# Patient Record
Sex: Male | Born: 1955 | Race: White | Hispanic: No
Health system: Midwestern US, Academic
[De-identification: ages and names within clinical notes are randomized; demographics above are authoritative.]

---

## 2016-03-07 IMAGING — CR ABDOMEN
3 series · 3 of 3 positions shown · non-contrast
Comparison: none

PROCEDURE: ABDOMEN

[abd uprt x-wise]
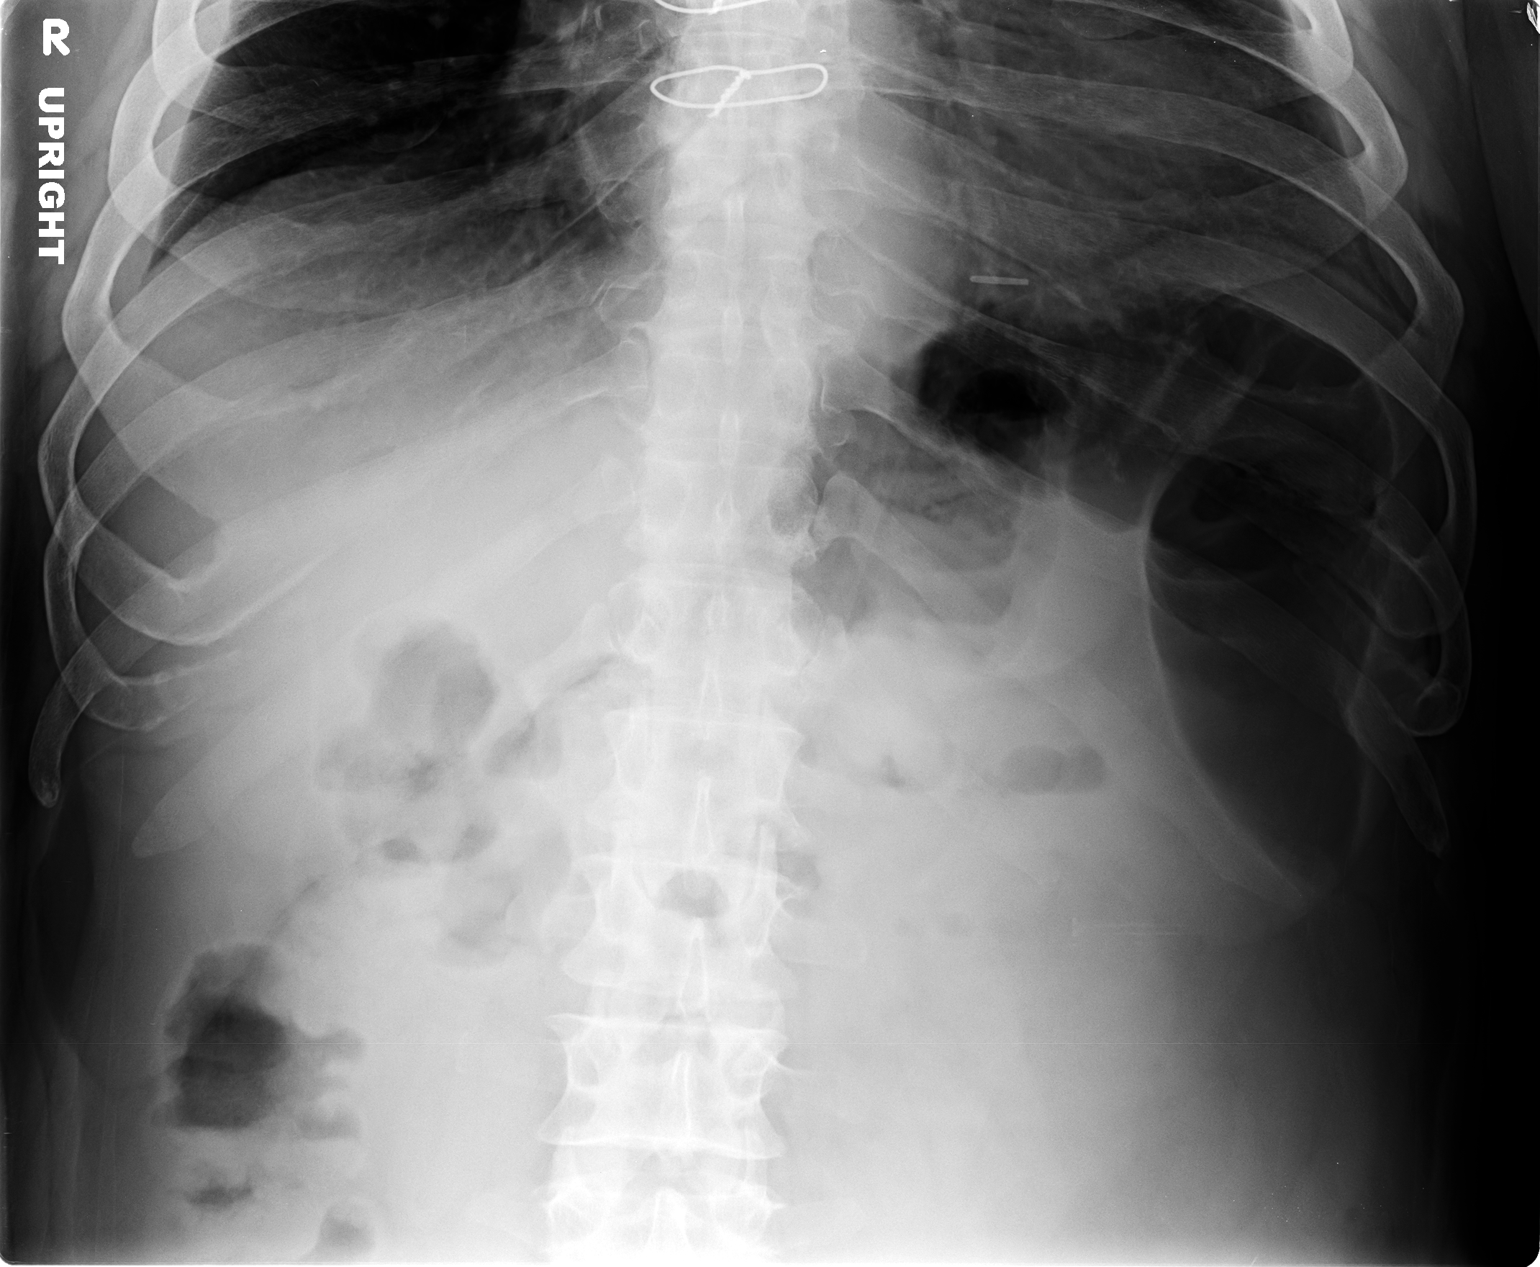

[abd supine x-wise (1 of 2)]
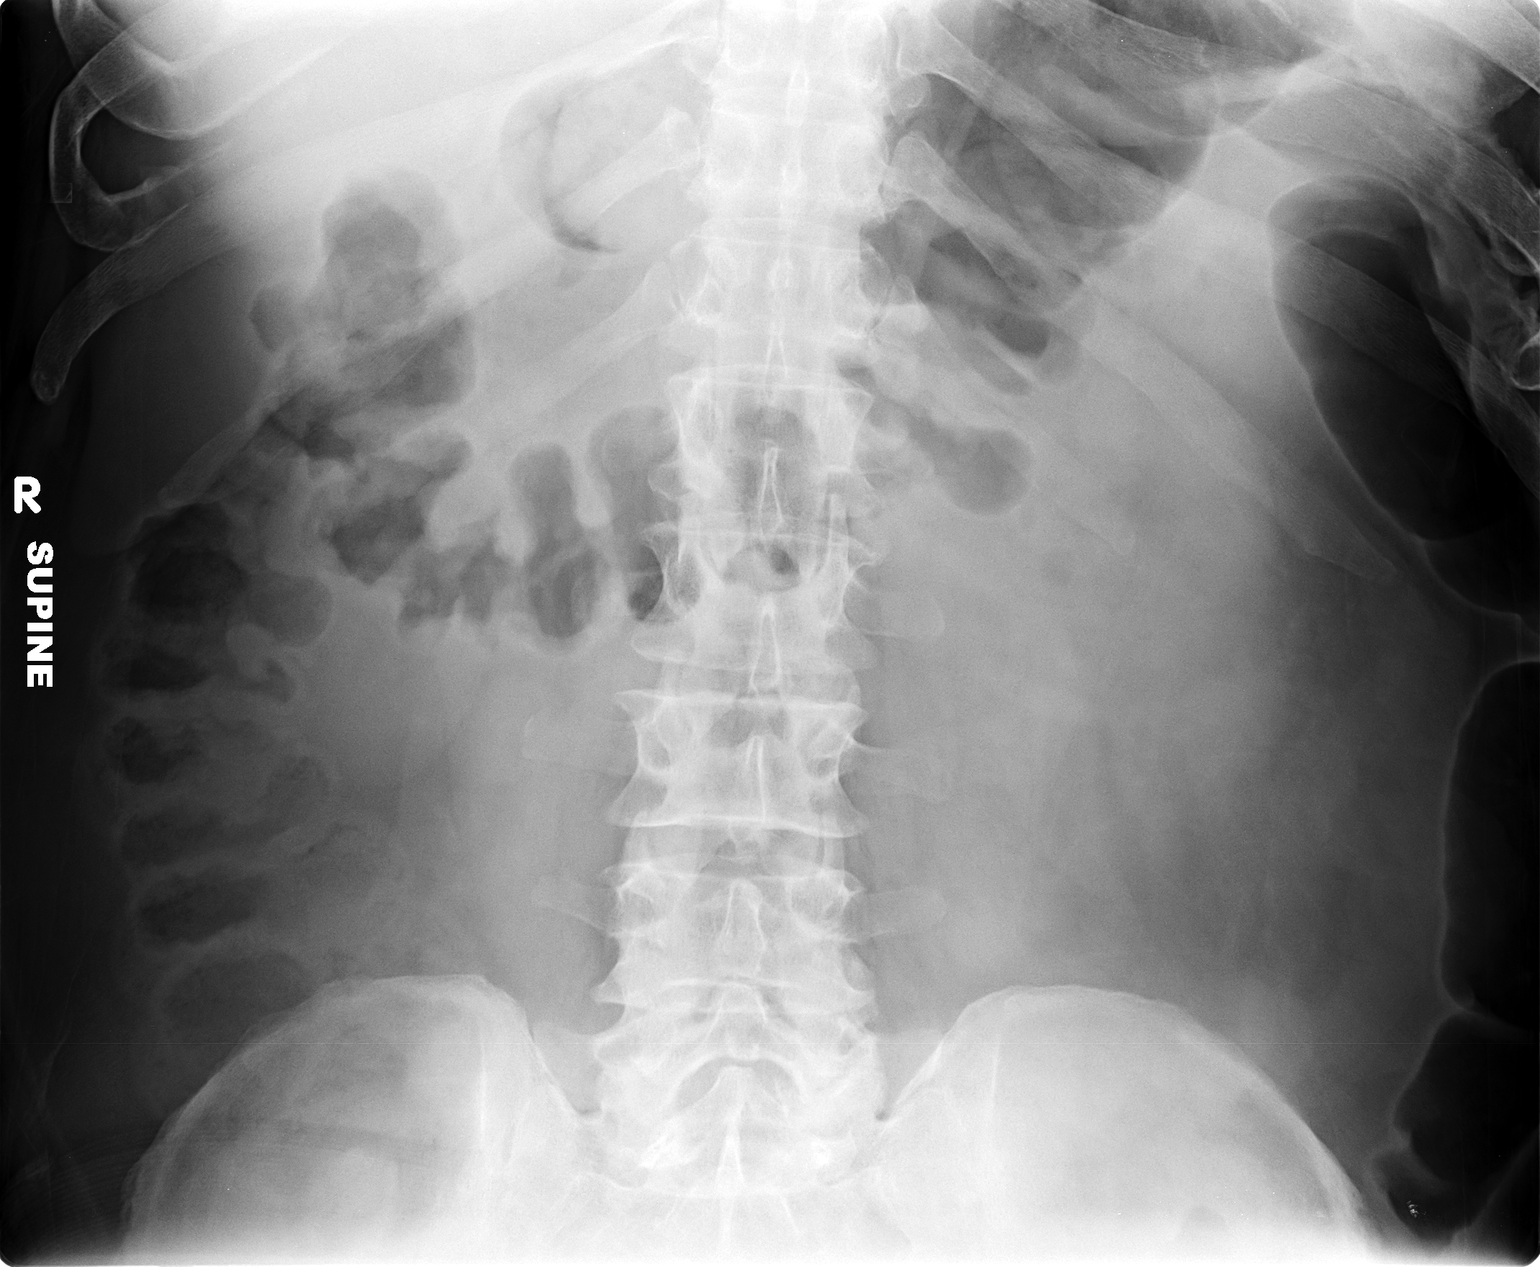

[abd supine x-wise (2 of 2)]
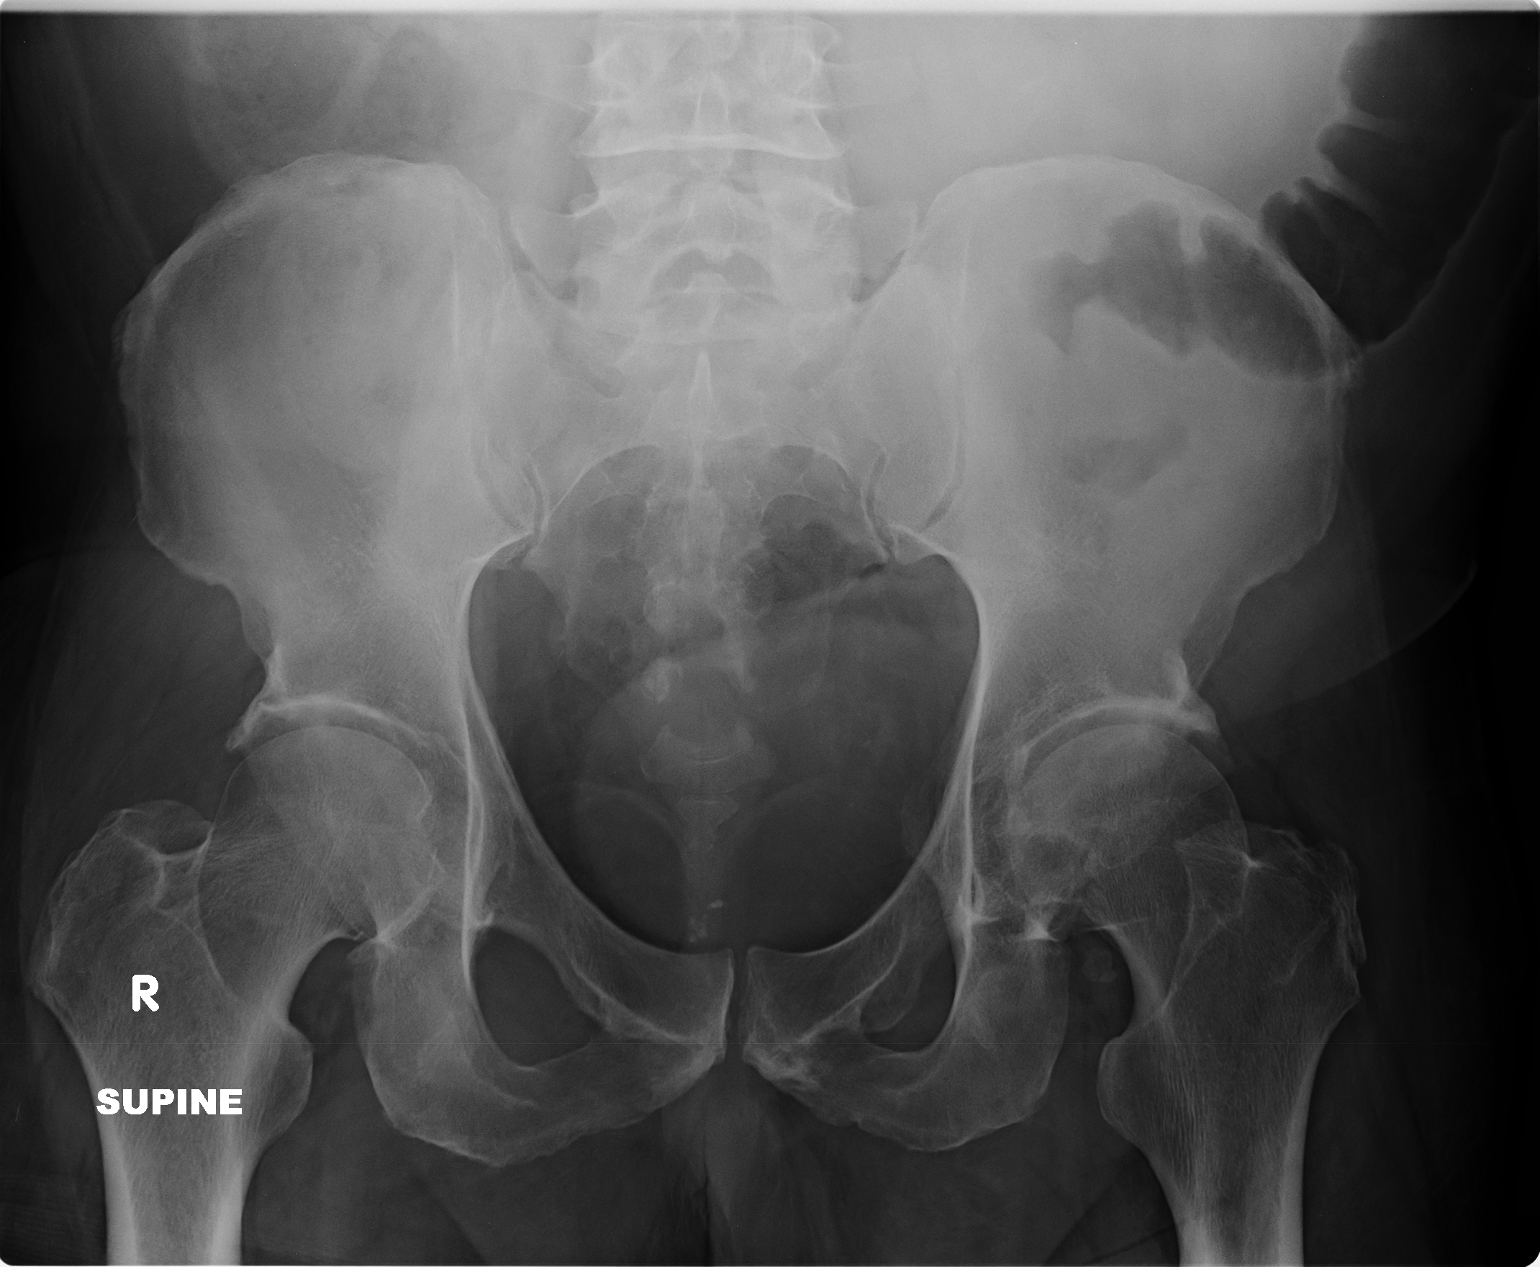

[3 of 3 positions shown; findings below may reference images not displayed]

FINDINGS: Flat and upright images of the abdomen without comparison shows a nonspecific bowel gas
pattern without pathologic dilation. Intraluminal air filled loops of bowel are seen down to the
level of
rectum. Scattered mild to moderate amount of stool is seen throughout the colon and into the rectal
vault. No pathologic calcifications are seen. No free air seen by plain film exam.
IMPRESSION: Nonobstructive bowel gas pattern without free air.

## 2016-03-24 IMAGING — CR CHEST
1 series · 1 of 1 positions shown · non-contrast
Comparison: none

[chest port x-wise]
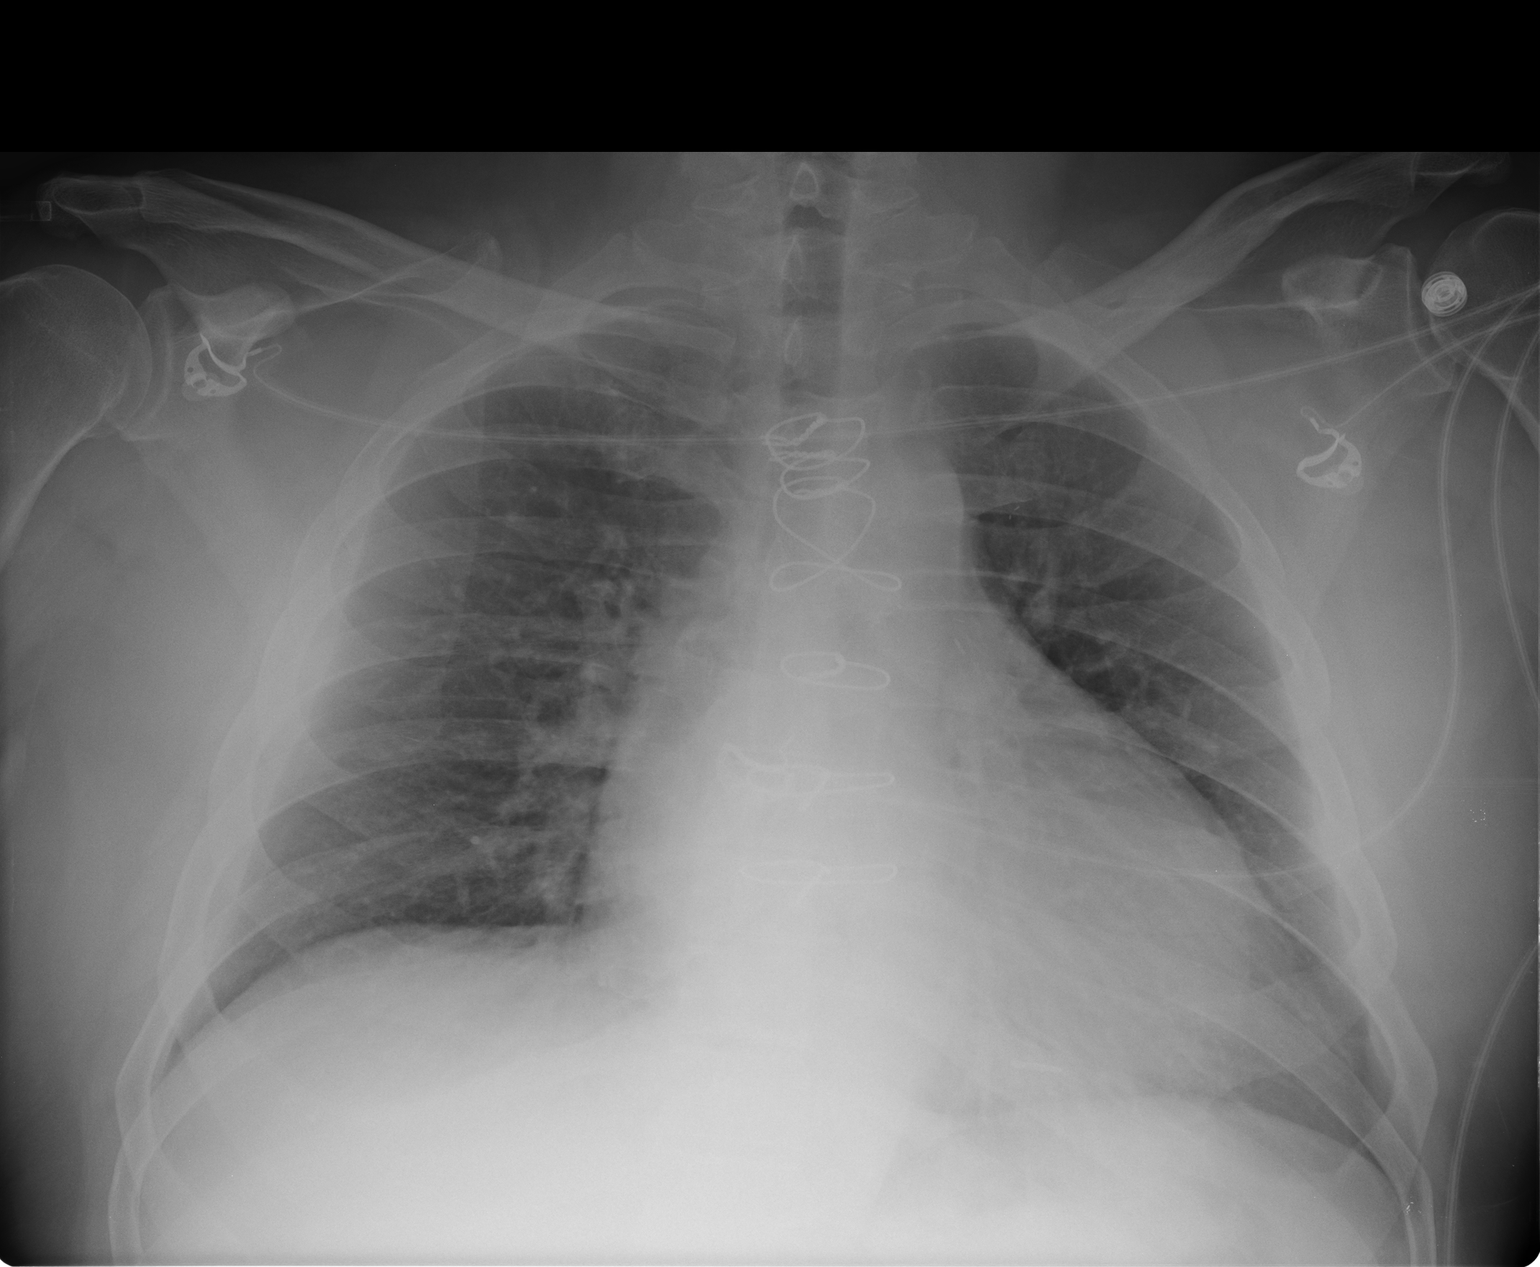

[1 of 1 positions shown; findings below may reference images not displayed]

DIAGNOSTIC STUDIES

EXAM
RADIOLOGICAL EXAMINATION, CHEST; SINGLE VIEW, FRONTAL CPT 70404

INDICATION
Chest pain

TECHNIQUE
Single AP view chest

COMPARISONS
No prior studies are available for comparison.

FINDINGS
The heart is enlarged without pulmonary venous engorgement. Postsurgical changes of median
sternotomy and CABG are evident. There is no dense pulmonary consolidation, pleural effusion, or
pneumothorax.

IMPRESSION
Cardiomegaly without evidence of congestive heart failure or dense consolidation to suggest
pneumonia.
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
## 2016-07-12 IMAGING — CR CHEST
3 series · 3 of 3 positions shown · non-contrast
Comparison: none

[shoulder external]
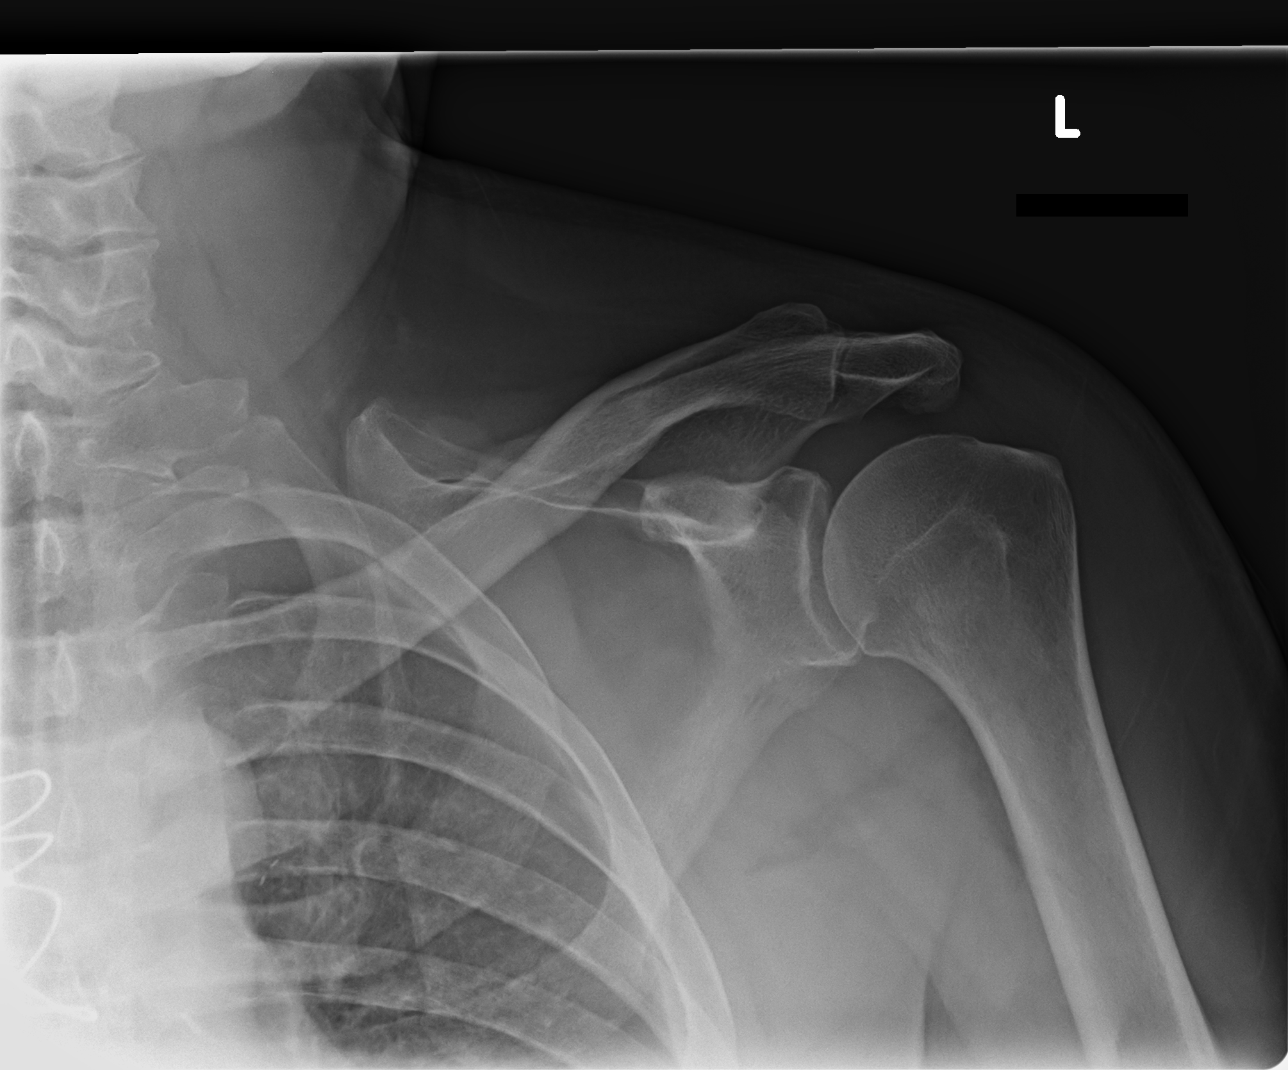

[shoulder internal]
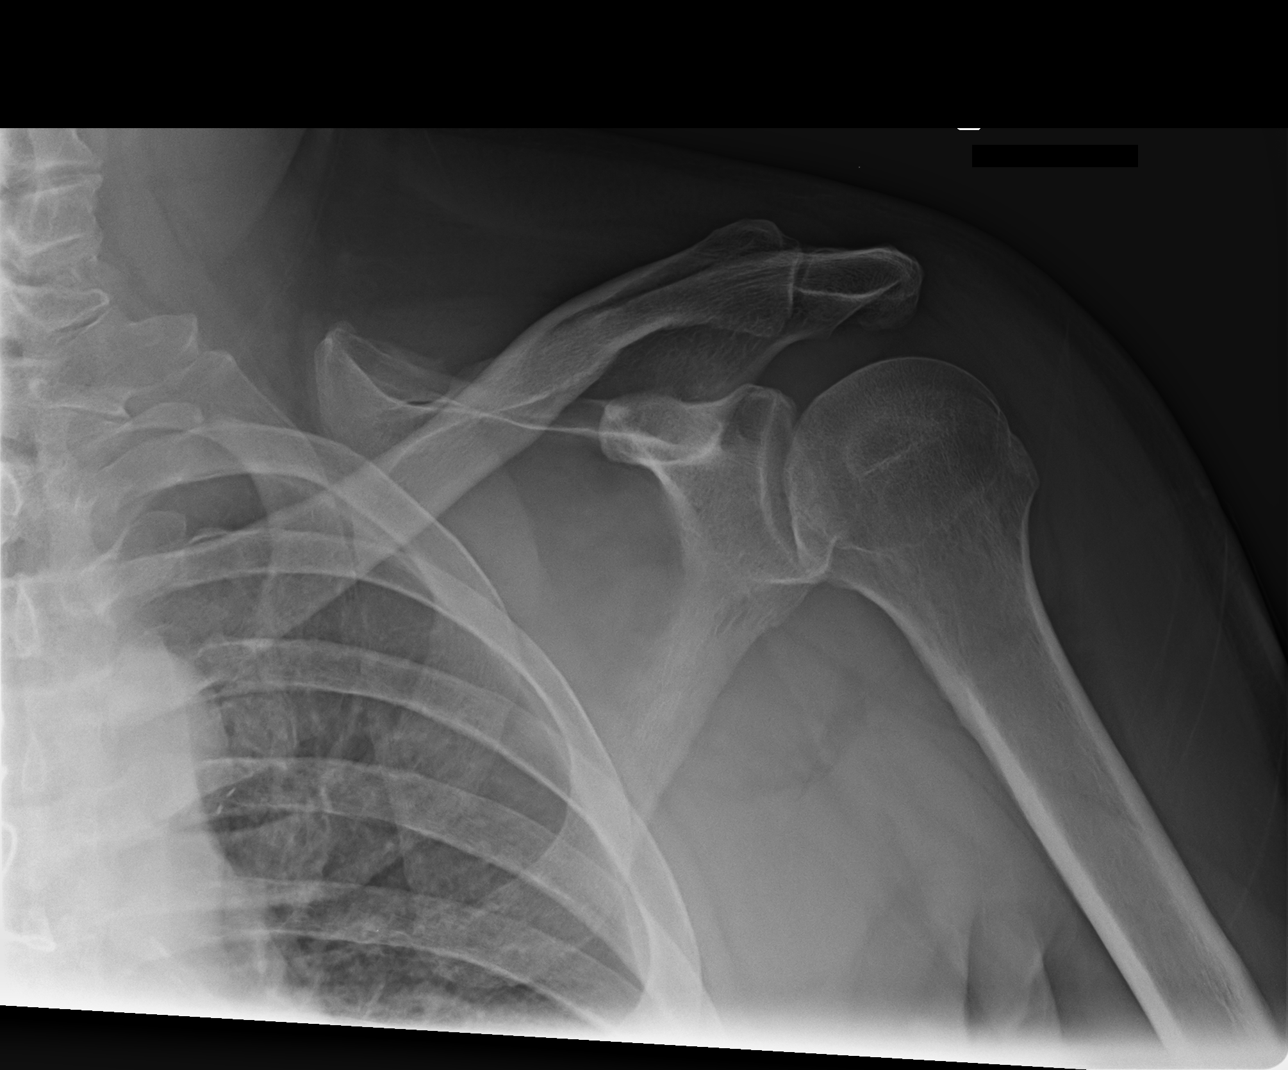

[shoulder y-view]
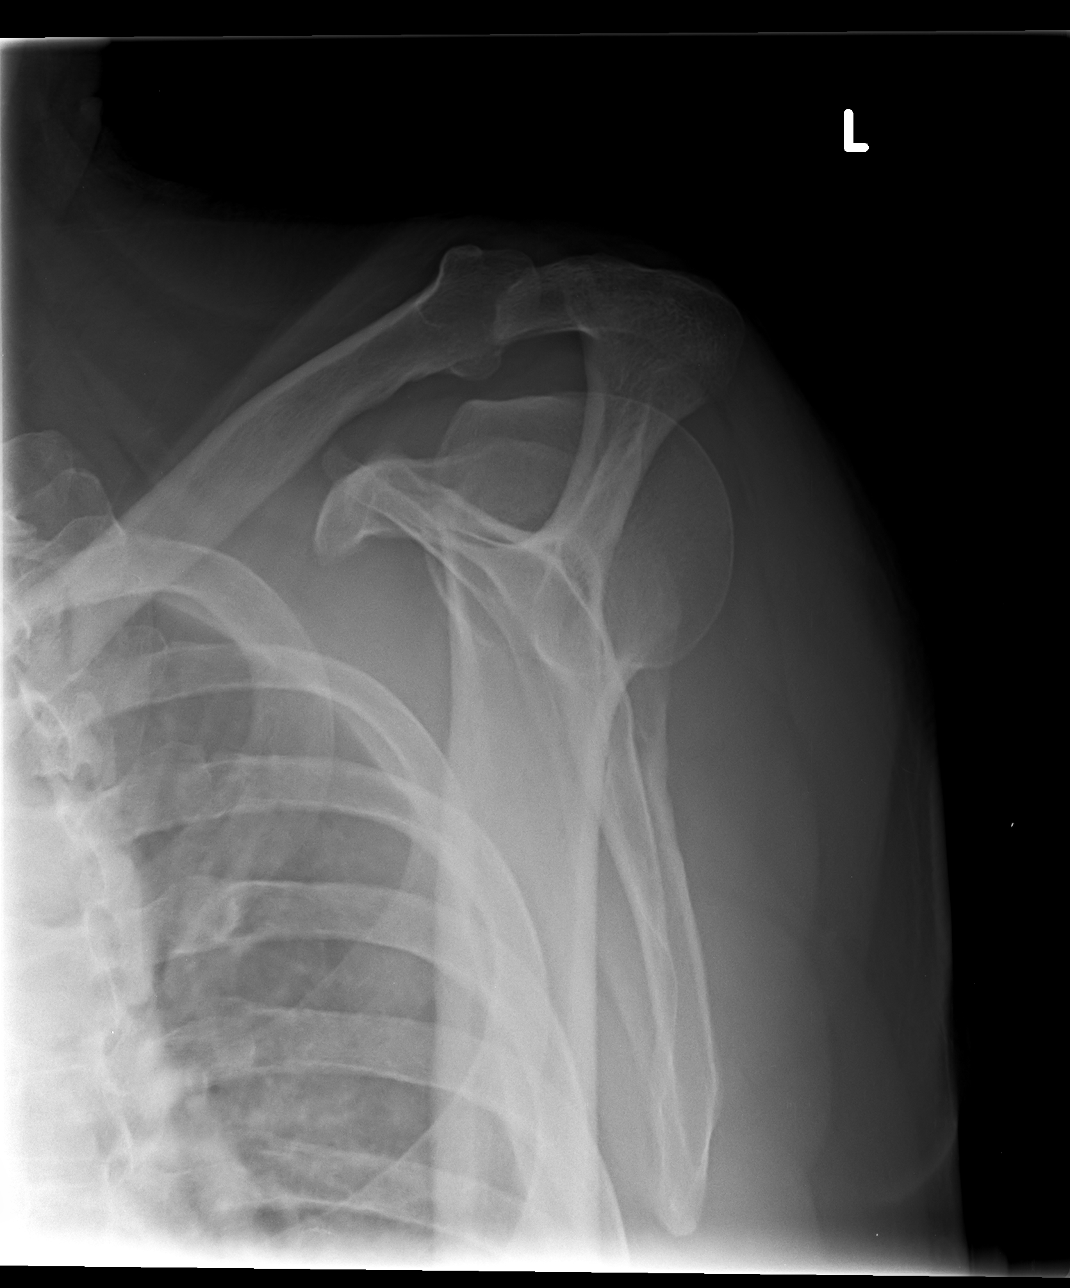

[3 of 3 positions shown; findings below may reference images not displayed]

DIAGNOSTIC STUDIES

EXAM

RADIOLOGICAL EXAMINATION SHOULDER; COMPLETE, MINIMUM 2 VIEWS CPT 99090

INDICATION

NECK PAIN INTO SHOULDER
PT STATES HAS NECK PAIN AND RADIATES INTO LEFT SHOULDER. TJ/CK

TECHNIQUE

AP internal external and Y views of the left shoulder was obtained.

COMPARISONS

No priors available for comparison.

FINDINGS

[No acute fracture or dislocation.] [AC joint appears aligned.] Mild subchondral sclerosis within
the glenohumeral and acromioclavicular joints.

IMPRESSION

Mild osteoarthritis.

Further evaluation with an MRI is suggested if the patient's symptoms persist and if clinically
indicated.

## 2016-07-12 IMAGING — CR NECK
4 series · 4 of 4 positions shown · non-contrast
Comparison: none

[c-spine lat]
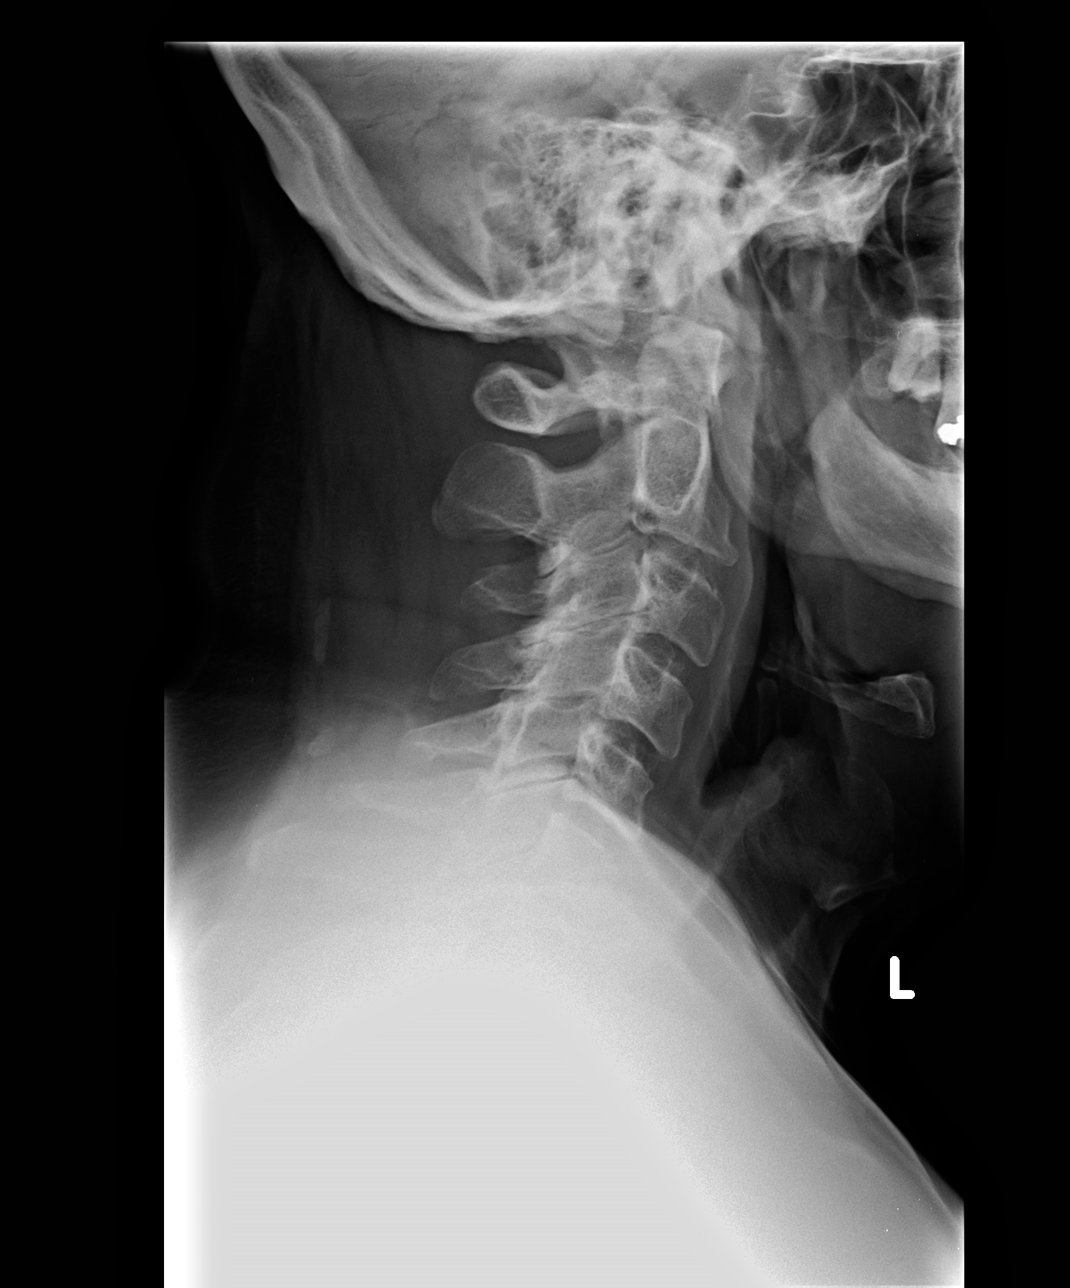

[c-spine ap]
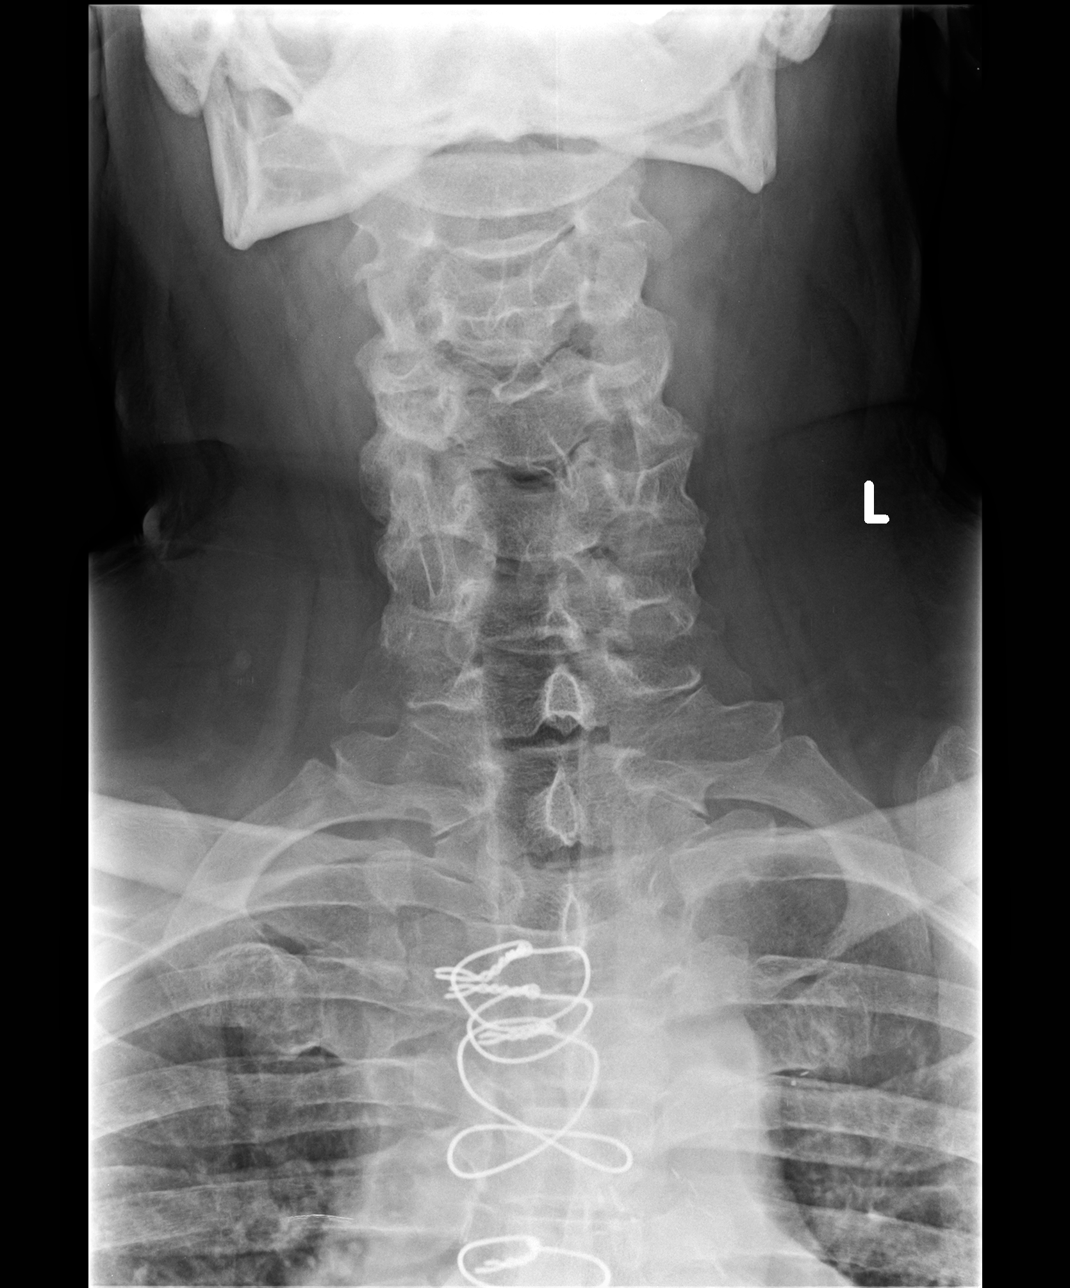

[odontoid]
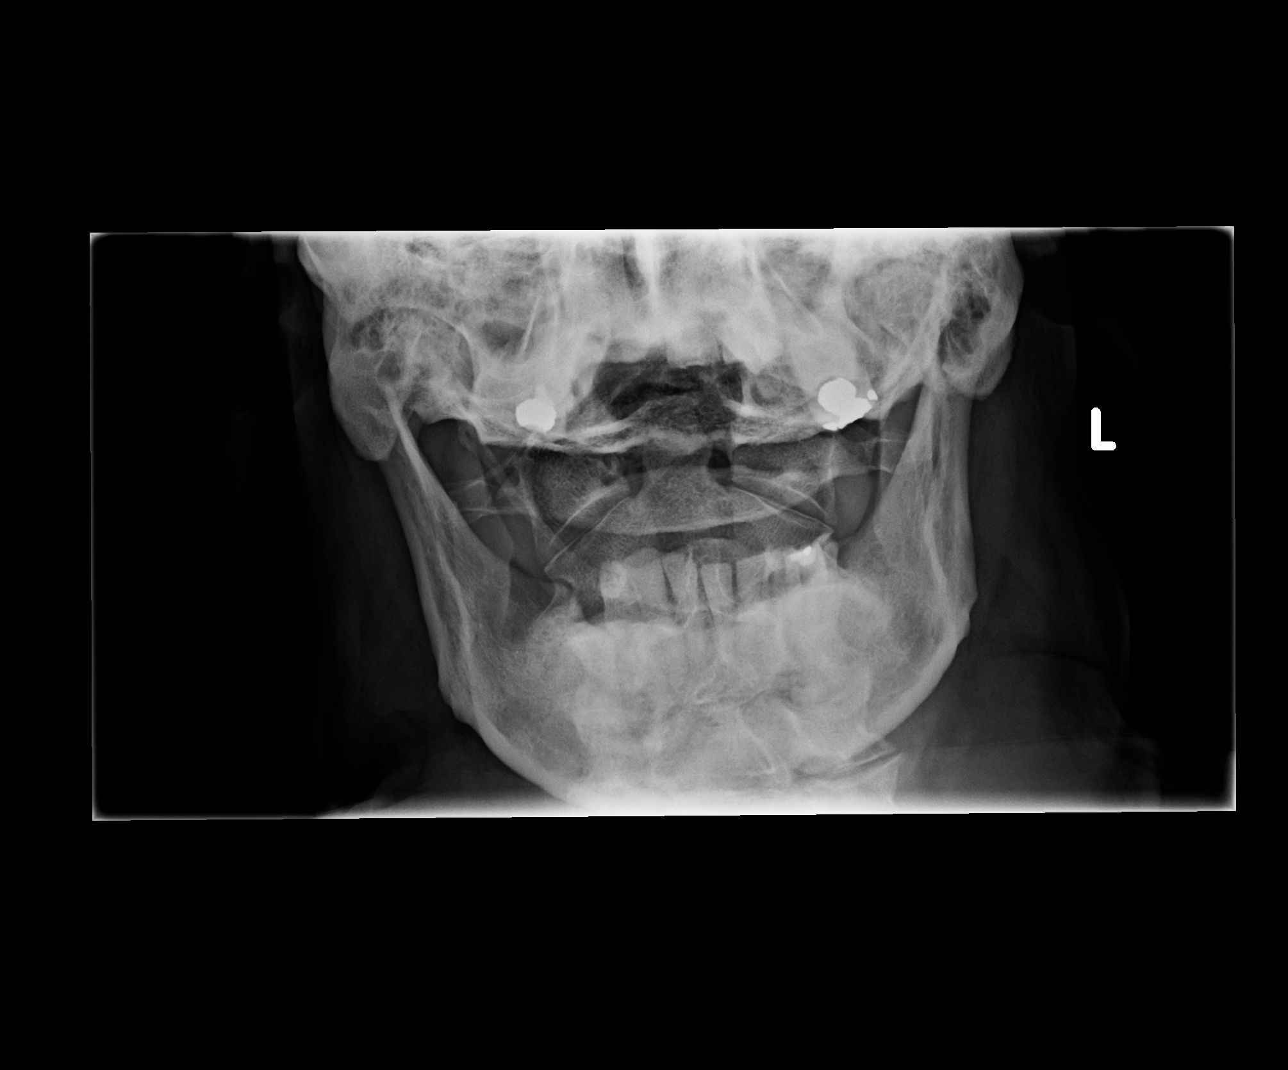

[swimmers]
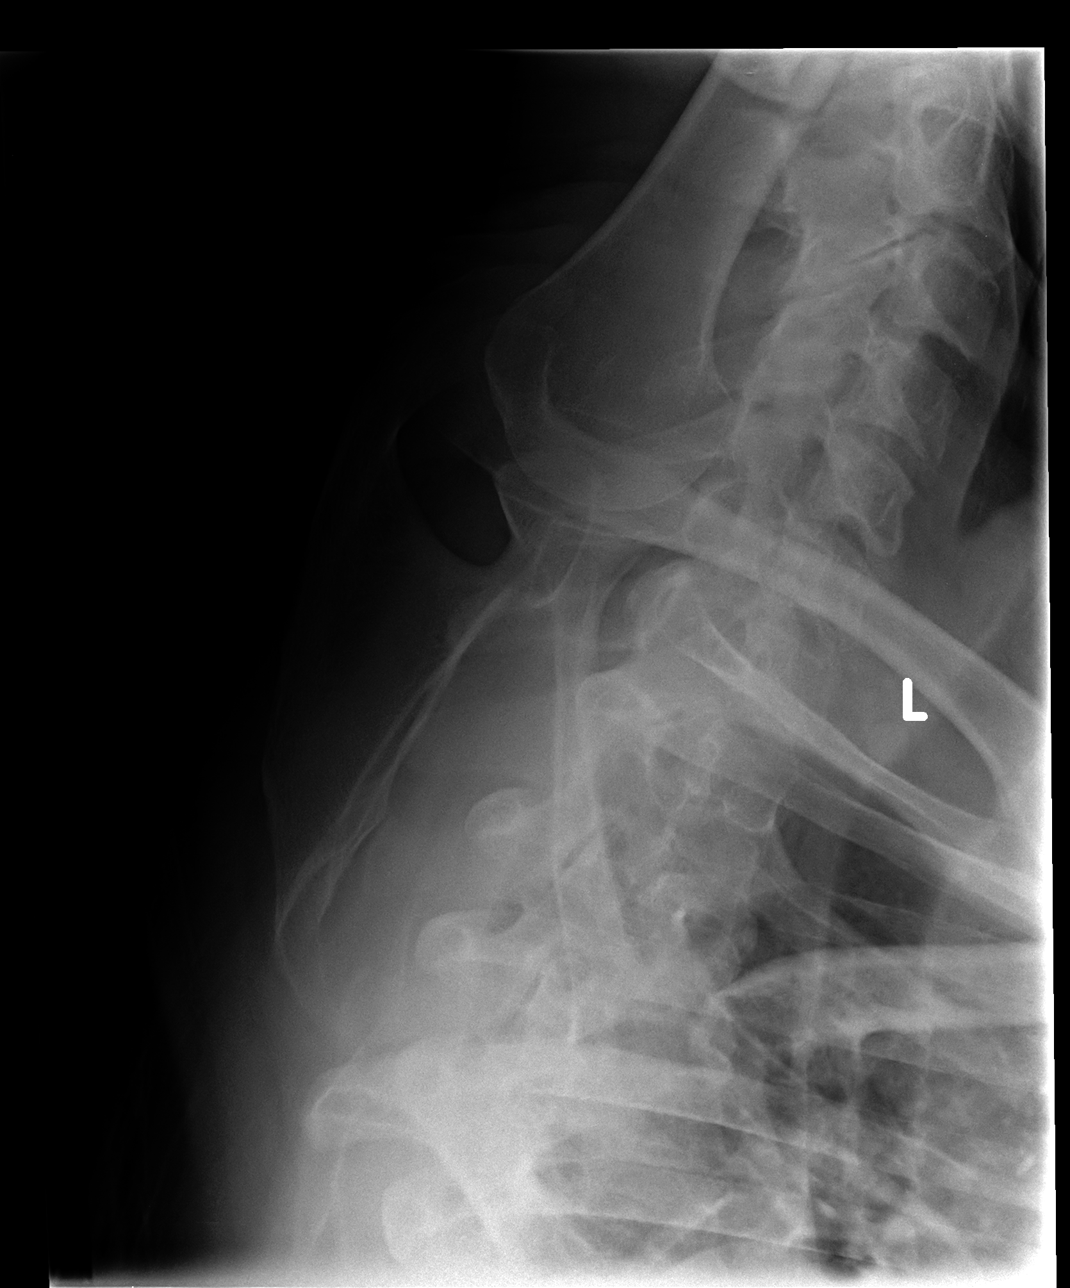

[4 of 4 positions shown; findings below may reference images not displayed]

DIAGNOSTIC STUDIES

EXAM

RADIOLOGICAL EXAMINATION, SPINE, CERVICAL 2 OR 3 VIEWS, CPT 35494

INDICATION

NECK PAIN INTO SHOULDER
PT STATES HAS NECK PAIN AND RADIATES INTO LEFT SHOULDER. TJ/CK

COMPARISONS

No priors available for comparison.

FINDINGS

There is mild retrolisthesis of C2. No acute displaced fracture or dislocation. There is mild loss
of disc height with associated sclerosis throughout the cervical spine. There is mild associated
facet arthropathy within the mid cervical spine. No significant prevertebral soft tissue swelling.

IMPRESSION

Mild multilevel degenerative changes throughout the cervical spine.

Further evaluation with MRI is suggested if clinically indicated.

## 2016-11-28 IMAGING — CT CT HEAD WO CONTRAST
1 series · 15 of 30 positions shown, 19 images · non-contrast
Comparison: none

[Series 2: brain w/o 4.8 brain · axial · non-contrast · 0.48mm/px · z∈[+104,+260]mm · 15 of 36 slices shown, 19 images]
[im 2/36  brain]
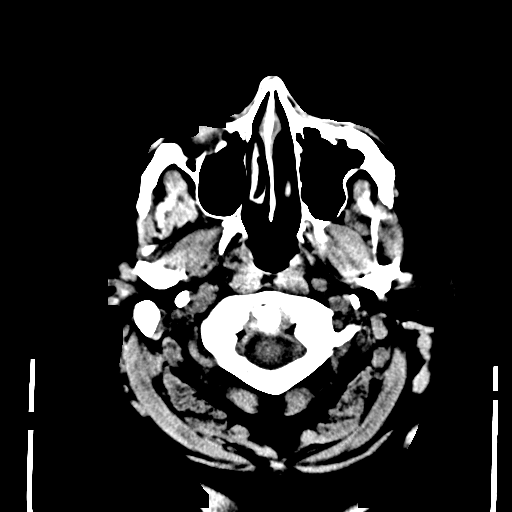
[im 2/36  bone]
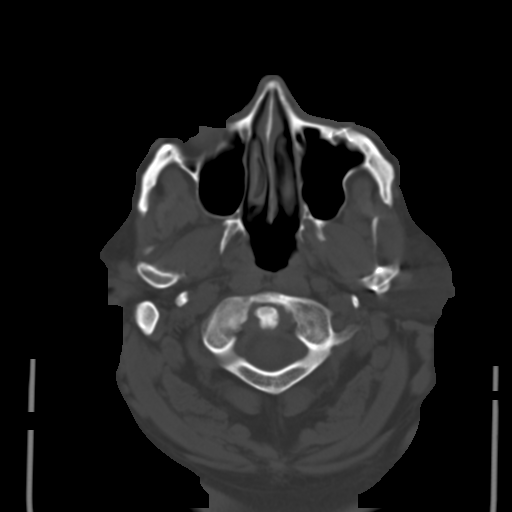
[im 4/36  brain]
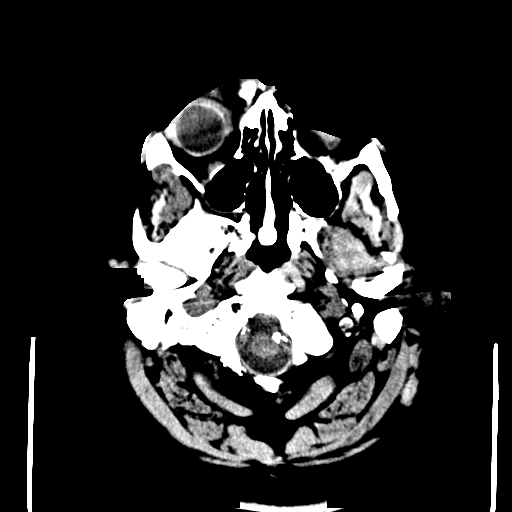
[im 7/36  brain]
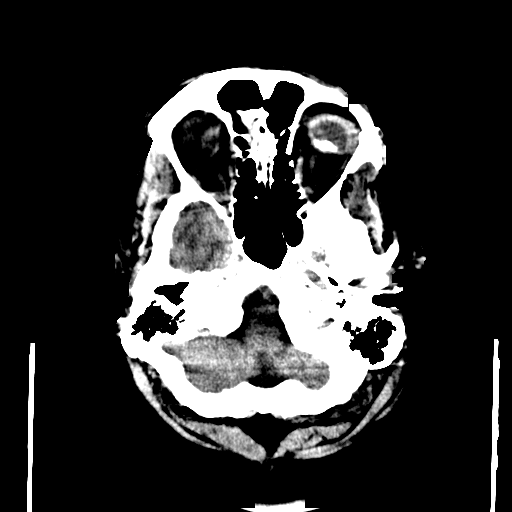
[im 9/36  brain]
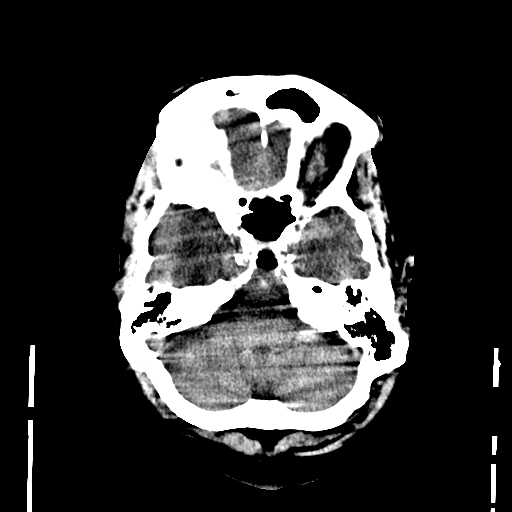
[im 11/36  brain]
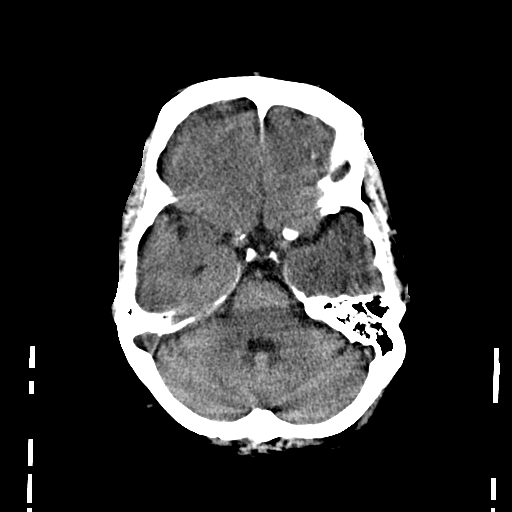
[im 11/36  bone]
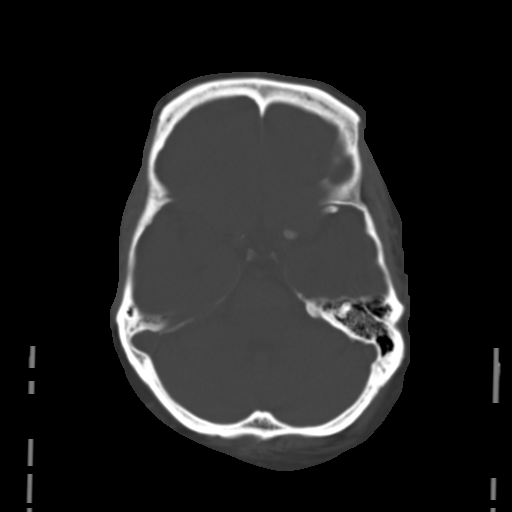
[im 14/36  brain]
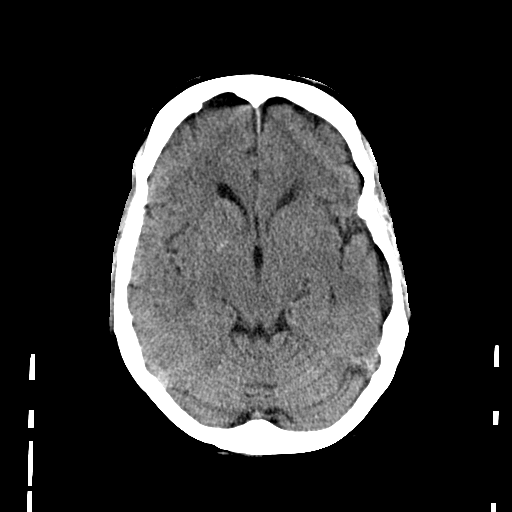
[im 16/36  brain]
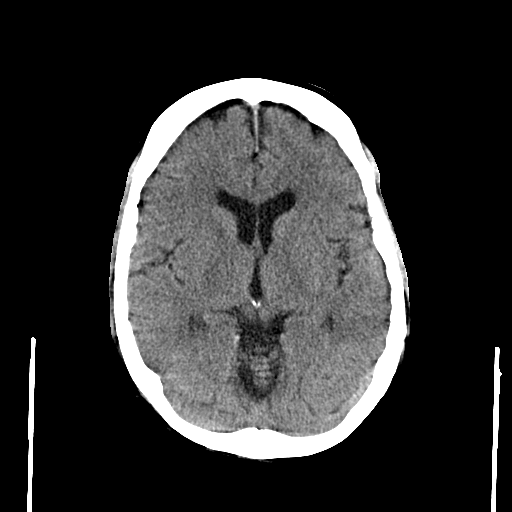
[im 19/36  brain]
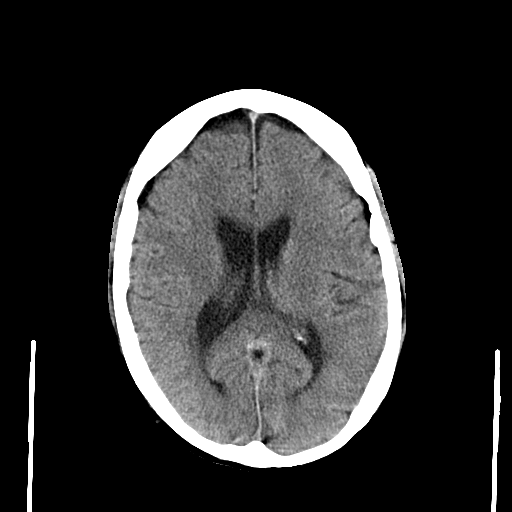
[im 20/36  brain]
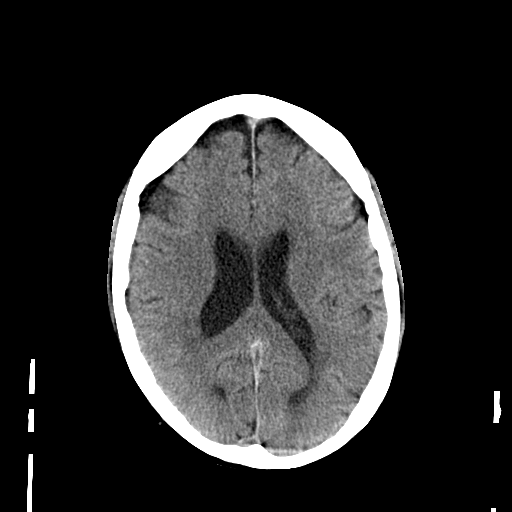
[im 20/36  bone]
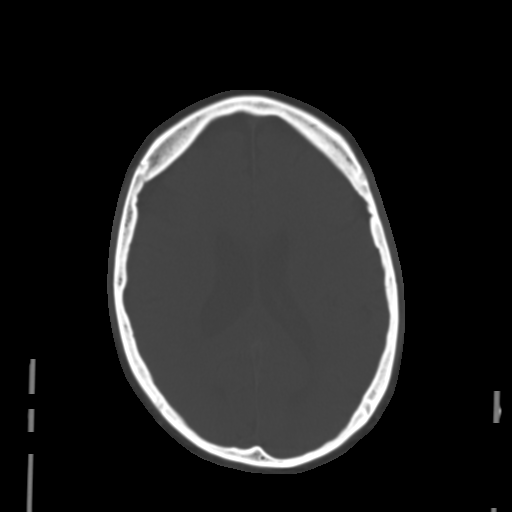
[im 22/36  brain]
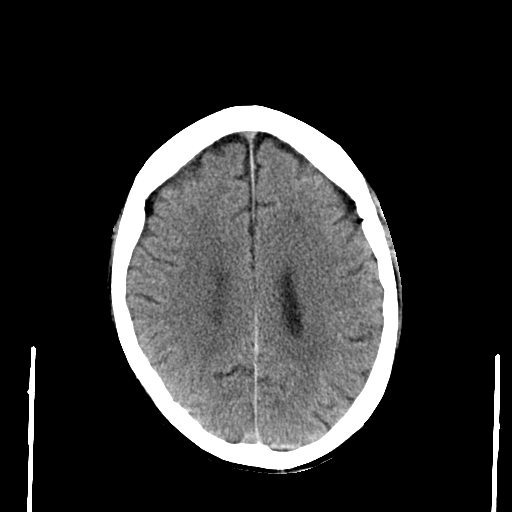
[im 25/36  brain]
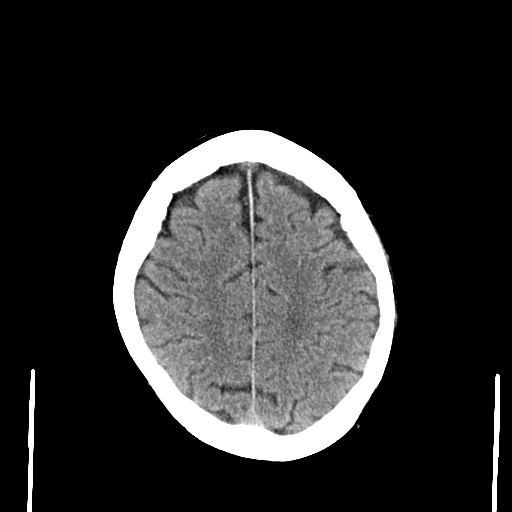
[im 27/36  brain]
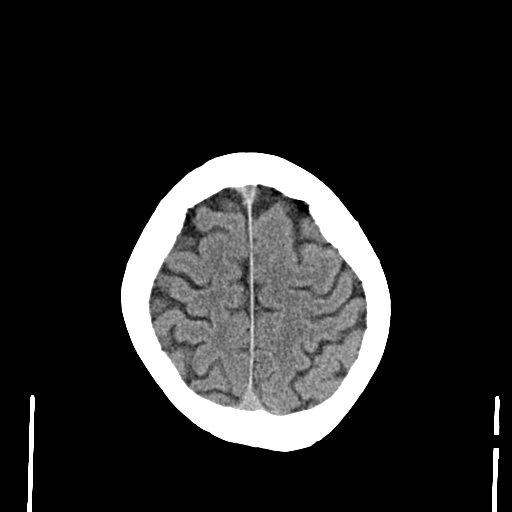
[im 29/36  brain]
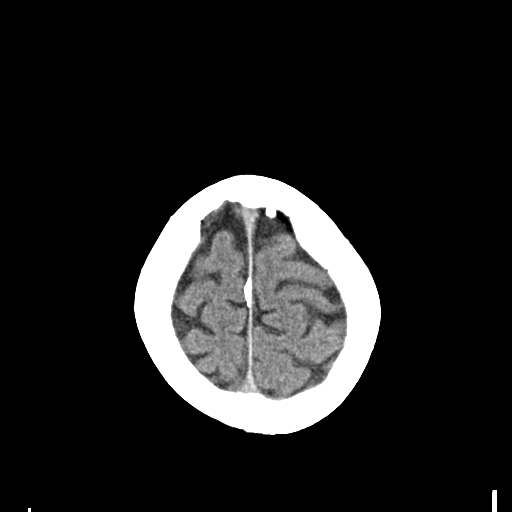
[im 29/36  bone]
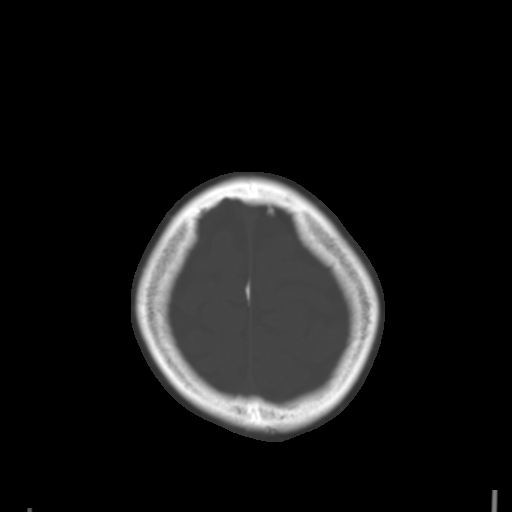
[im 32/36  brain]
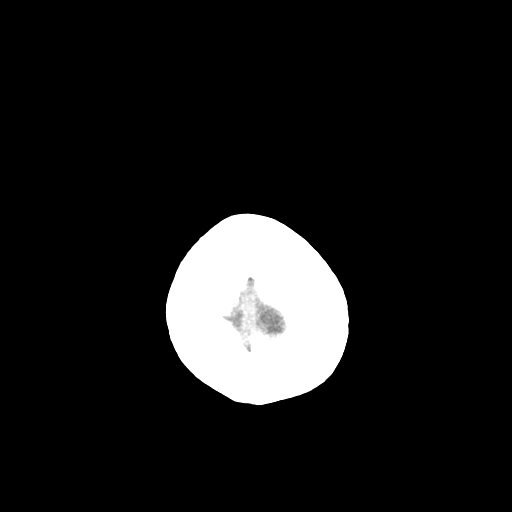
[im 34/36  brain]
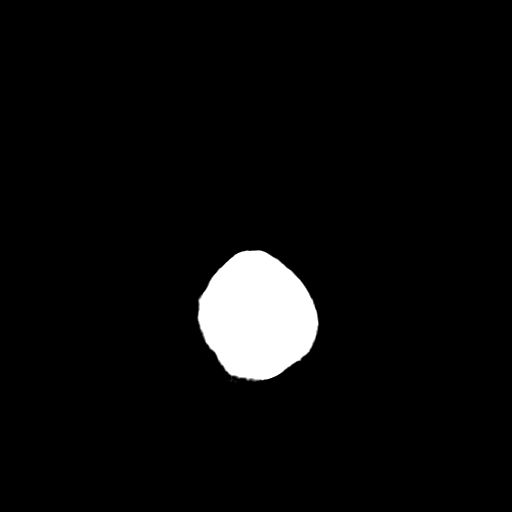

[15 of 30 positions shown; findings below may reference images not displayed]

EXAM
COMPUTED TOMOGRAPHY, HEAD OR BRAIN; WITHOUT CONTRAST MATERIAL CPT 98568

INDICATION
VISUAL DISTURBANCE, WEAK LOWER EXTREMITIES
Visual disturbance, weakness of lower extremity.
BG

TECHNIQUE
Multiple contiguous transaxial images were obtained through the brain utilizing a multidetector CT
scanner.
All CT scans at this facility use dose modulation, iterative reconstruction, and/or weight based
dosing when appropriate to reduce radiation dose to as low as reasonably achievable.

COMPARISONS
I do not have any previous examinations available for comparison at the time of dictation.

FINDINGS
There is diffuse cortical atrophy. The ventricles and sulci are accordingly prominent. There is no
mass effect or shift in the midline structures. Lucency throughout the centrum ovale is consistent
with deep white matter ischemic changes and periventricular leukomalacia.
Increased density is identified in the left eye globe is suggestive of perhaps a chronic retinal
detachment. The shape of the left eye globe is also abnormal. Clinical correlation is advised.
Findings may be chronic in nature. The skull base overlying and overlying calvarium are within
normal limits.

IMPRESSION
Cortical atrophy and age related involutional changes. Periventricular leukomalacia. No acute
intracranial hemorrhage. Chronic retinal detachment on the left is suspected.
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
## 2017-09-28 IMAGING — CT Head^_WITHOUT_CONTRAST (Adult)
1 series · 16 of 30 positions shown, 20 images · non-contrast
Comparison: none

[Series 2: brain w/o 4.8 brain · axial · non-contrast · 0.42mm/px · z∈[+132,+291]mm · 16 of 36 slices shown, 20 images]
[im 2/36  brain]
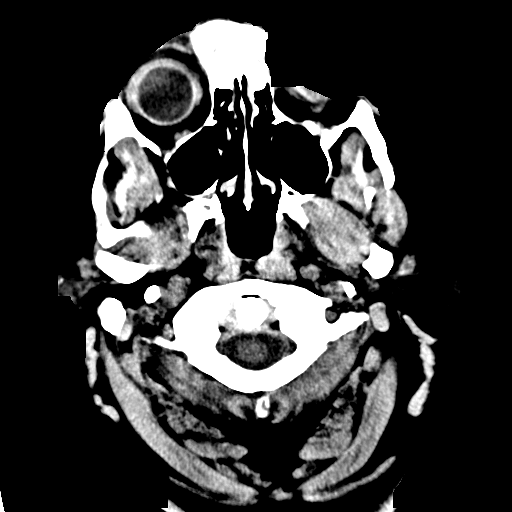
[im 2/36  bone]
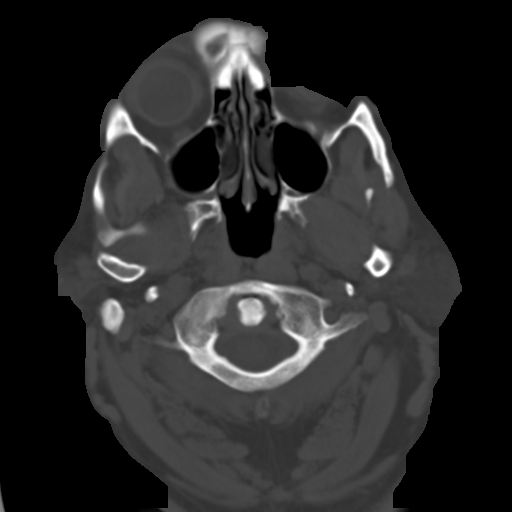
[im 4/36  brain]
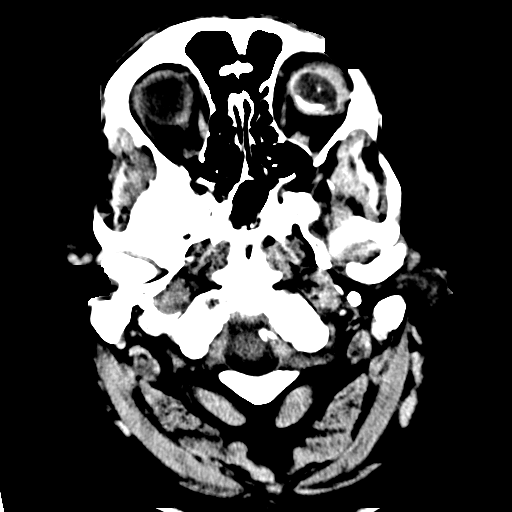
[im 7/36  brain]
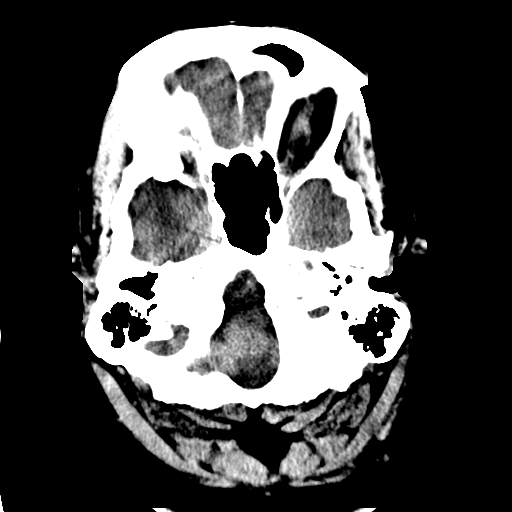
[im 9/36  brain]
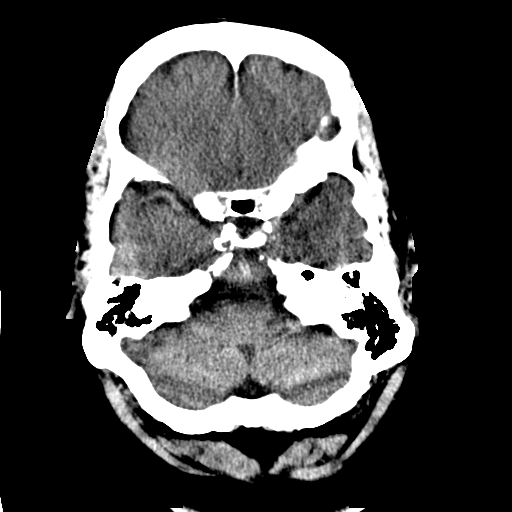
[im 10/36  brain]
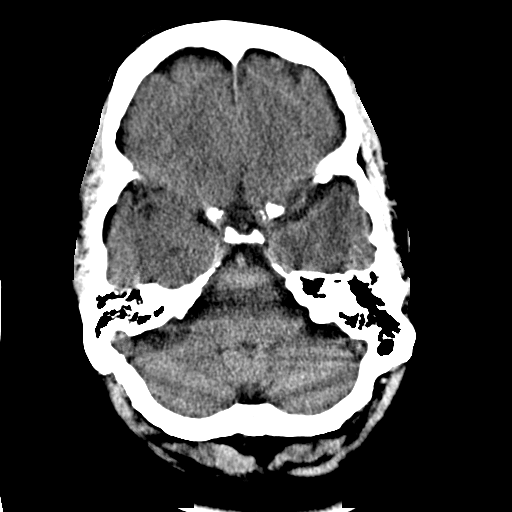
[im 10/36  bone]
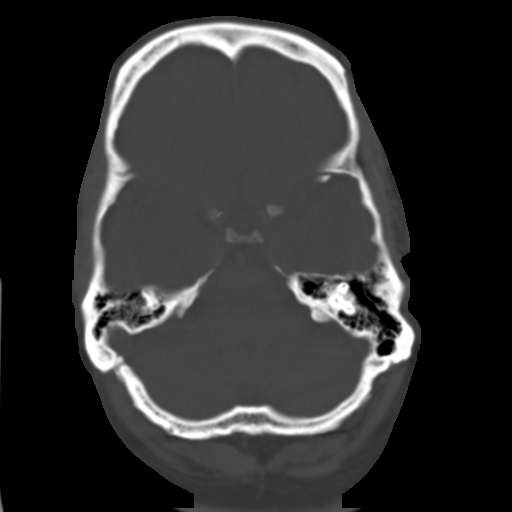
[im 13/36  brain]
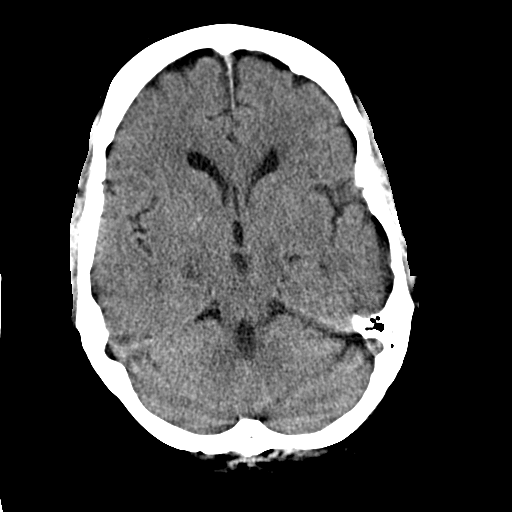
[im 15/36  brain]
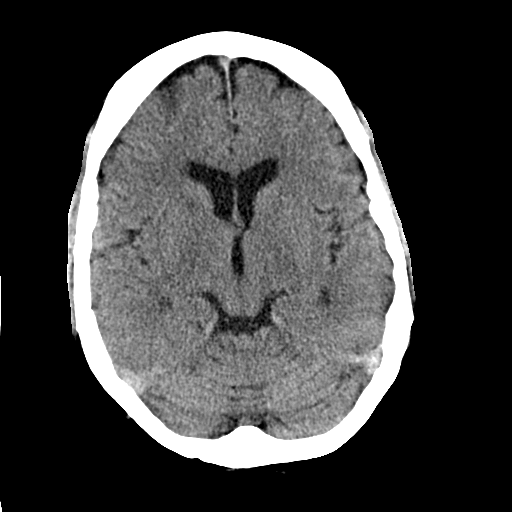
[im 17/36  brain]
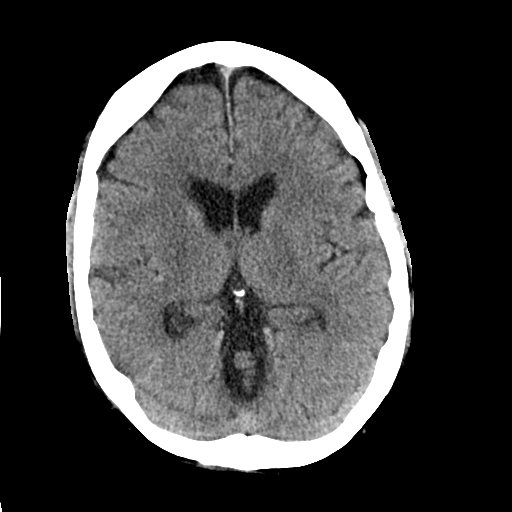
[im 19/36  brain]
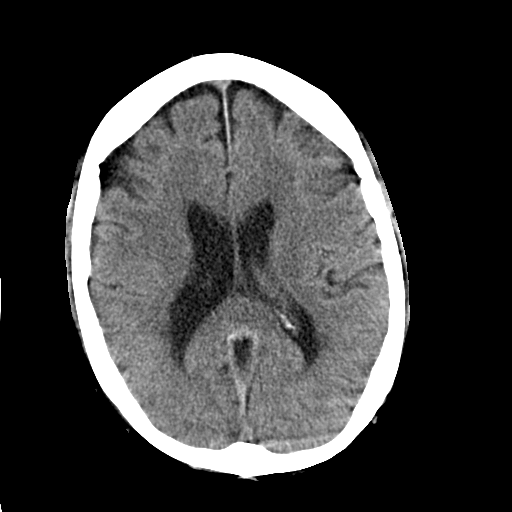
[im 19/36  bone]
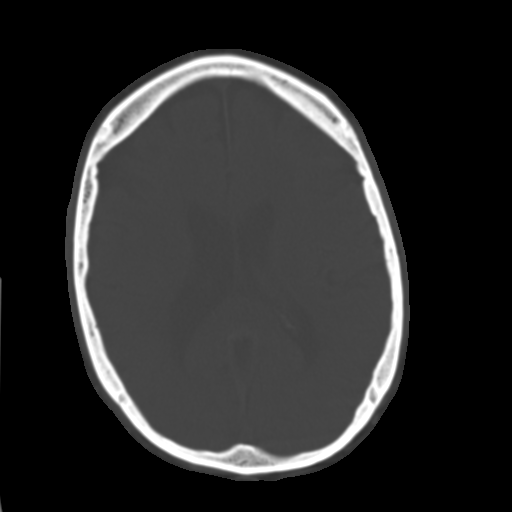
[im 21/36  brain]
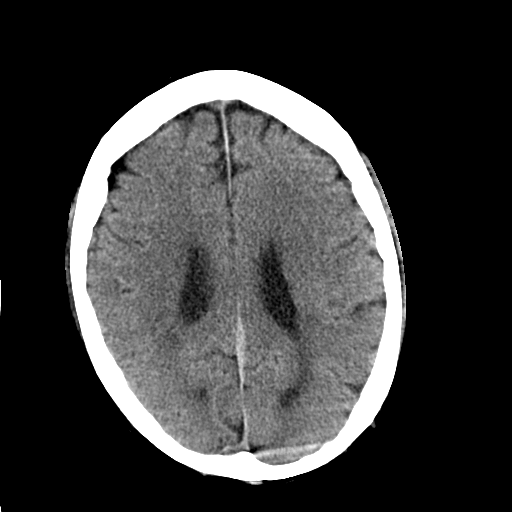
[im 23/36  brain]
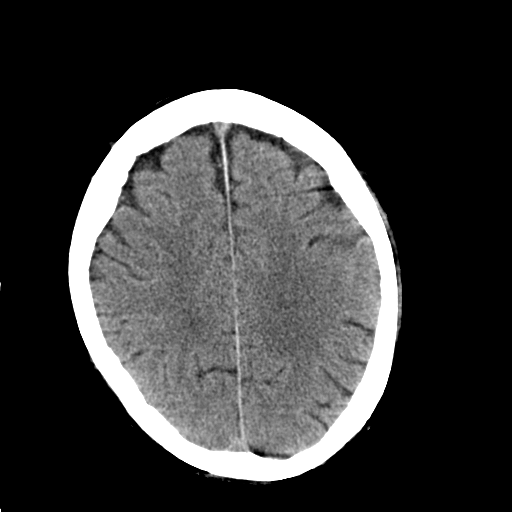
[im 26/36  brain]
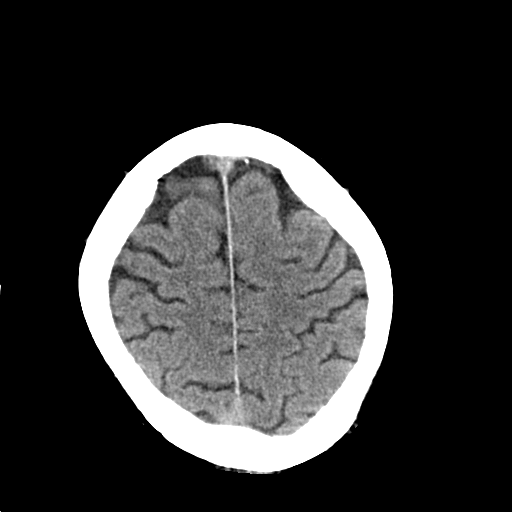
[im 27/36  brain]
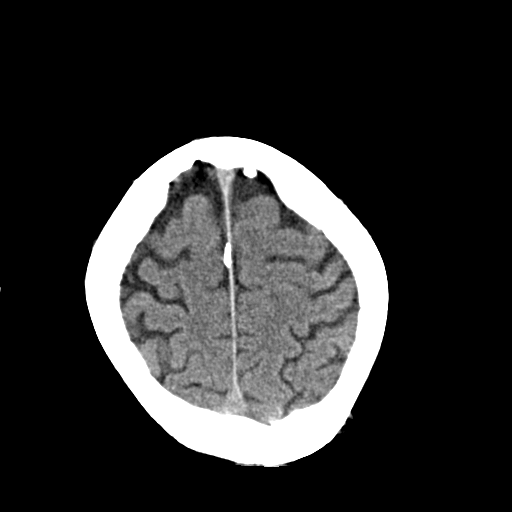
[im 27/36  bone]
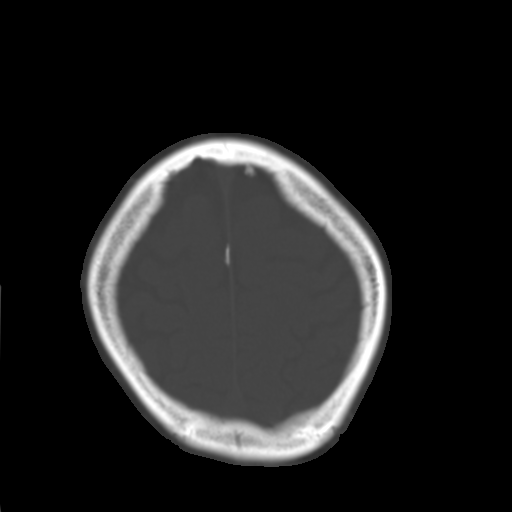
[im 29/36  brain]
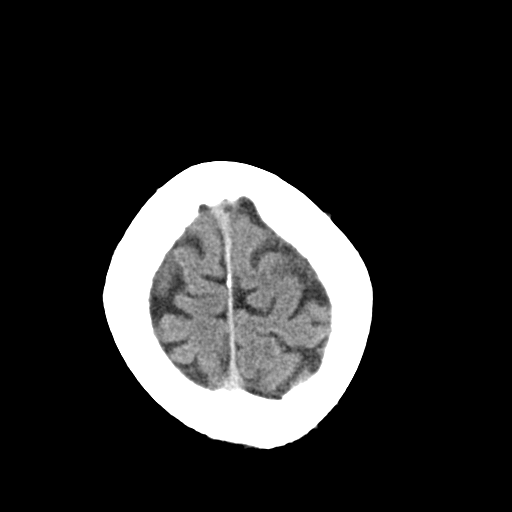
[im 32/36  brain]
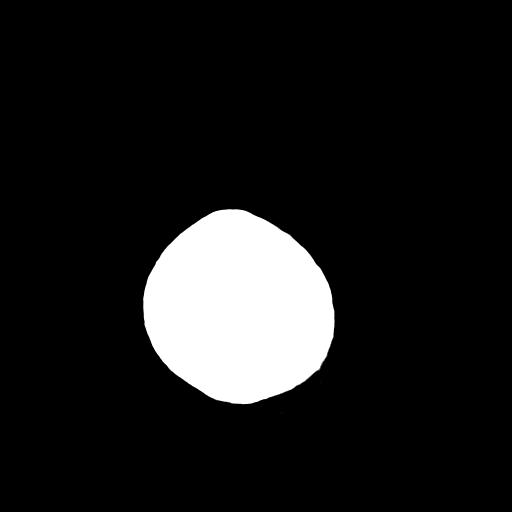
[im 34/36  brain]
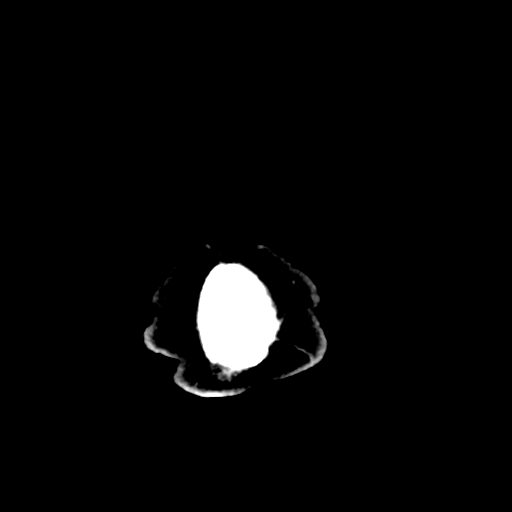

[16 of 30 positions shown; findings below may reference images not displayed]

All CT scans at this facility use dose modulation, iterative reconstruction, and/or weight based
dosing when appropriate to reduce radiation dose to as low as reasonably achievable.

EXAM

CT the brain.

INDICATION

Dizziness.

FINDINGS

The ventricles and sulci are within normal limits. There is no midline shift or hydrocephalus.

There is no intracranial hemorrhage or hematoma.

IMPRESSION

No hemorrhage or hydrocephalus is identified.

Tech Notes:

DIZZINESS X LAST NIGHT. AW

## 2017-09-28 IMAGING — CR CHEST
1 series · 1 of 1 positions shown · non-contrast
Comparison: none

[chest pa x-wise]
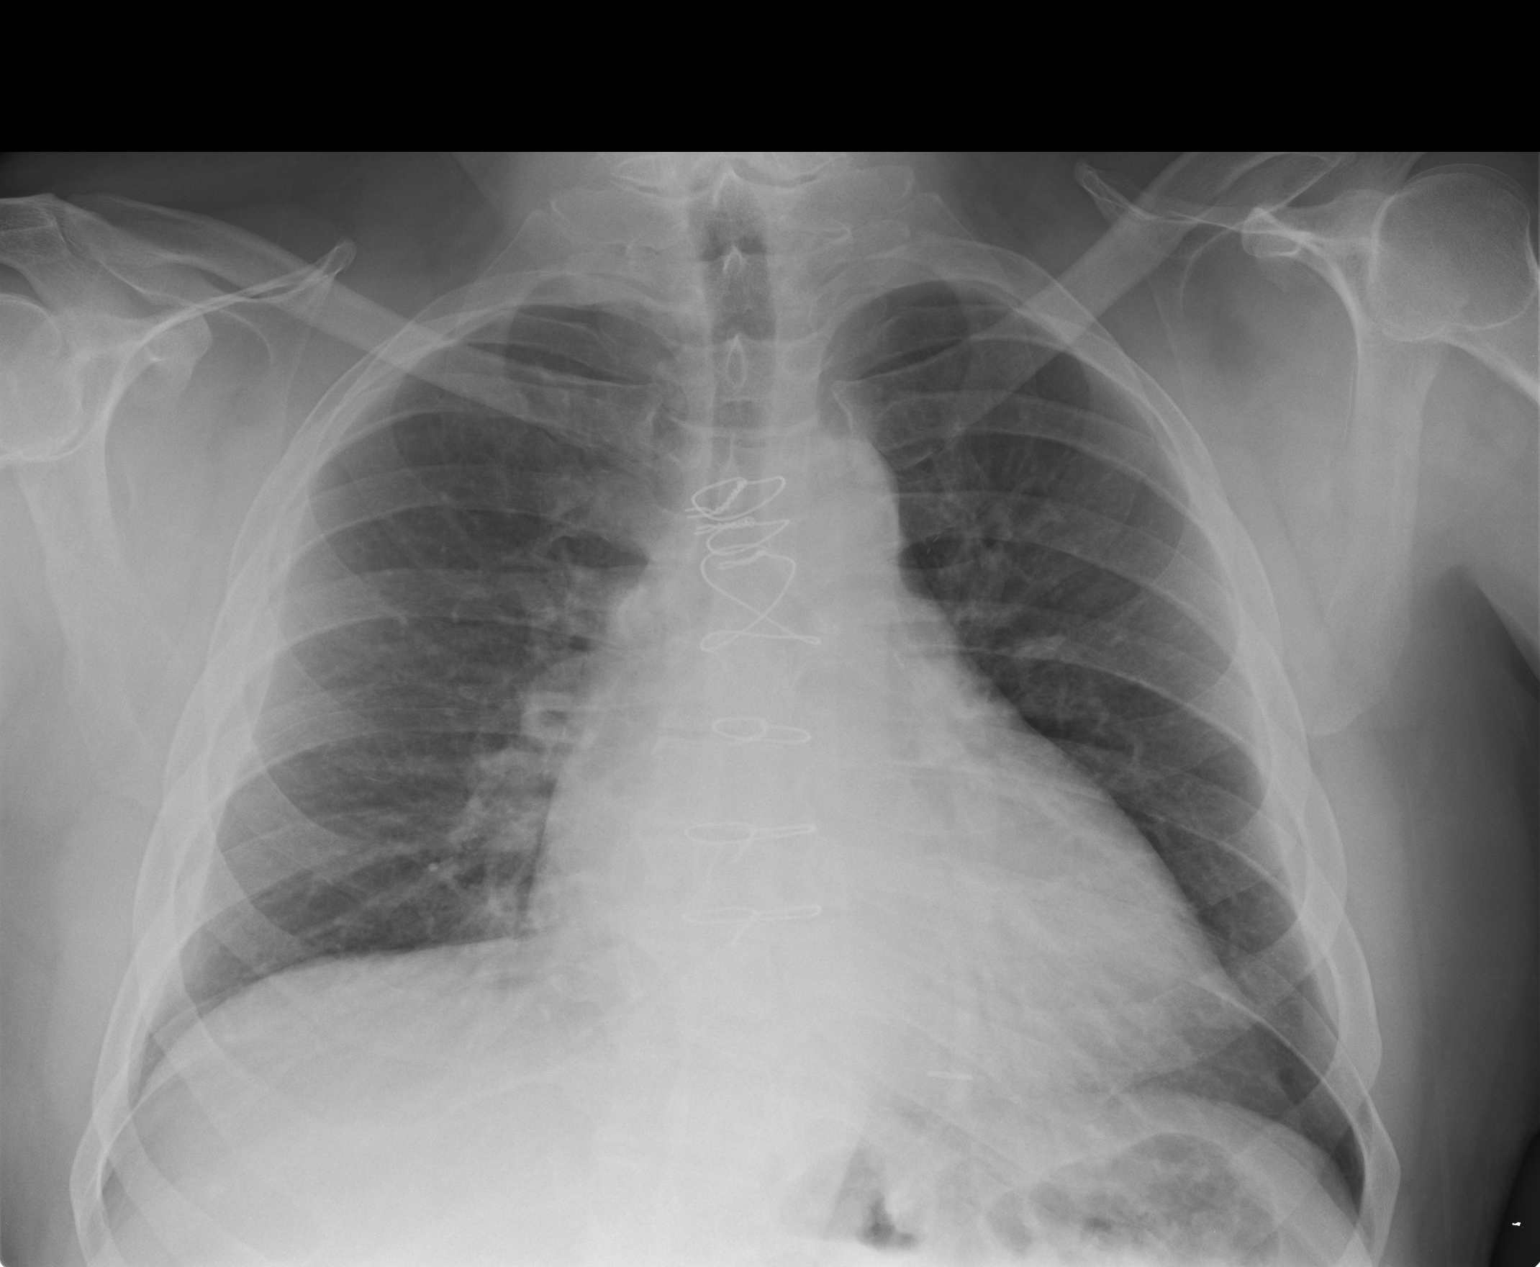

[1 of 1 positions shown; findings below may reference images not displayed]

EXAM
RADIOLOGICAL EXAMINATION, CHEST; SINGLE VIEW, FRONTAL CPT 51515

INDICATION
dizziness
DIZZINESS X LAST NIGHT. H/O QUADRUPLE BYPASS. AW/ME

TECHNIQUE
1 view of the chest was acquired.

COMPARISONS
[Previous examination dated 11/28/2016.

FINDINGS
The cardiac silhouette is enlarged. Sternotomy wire sutures are noted. The lungs are free of focal
consolidations.

IMPRESSION
No acute cardiopulmonary process. Cardiomegaly with clear lungs.

Tech Notes:

DIZZINESS X LAST NIGHT. H/O QUADRUPLE BYPASS. AW/ME

## 2017-11-06 IMAGING — CT Abdomen^1_ABDOMEN_PELVIS_WITH (Adult)
2 of 3 series · 17 of 42 positions shown, 19 images · IV contrast (APPLIED)
Comparison: none

[Series 3: abd/pelvis with 5.0 soft tissue · axial · 0.92mm/px · z∈[-569,-114]mm · 14 of 62 slices shown, 16 images]
[im 4/62  soft-tissue]
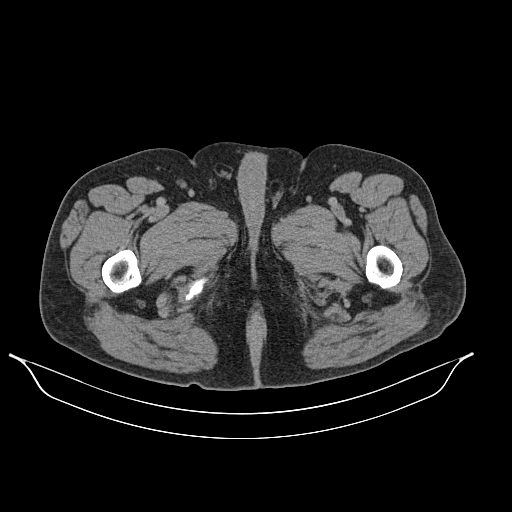
[im 4/62  bone]
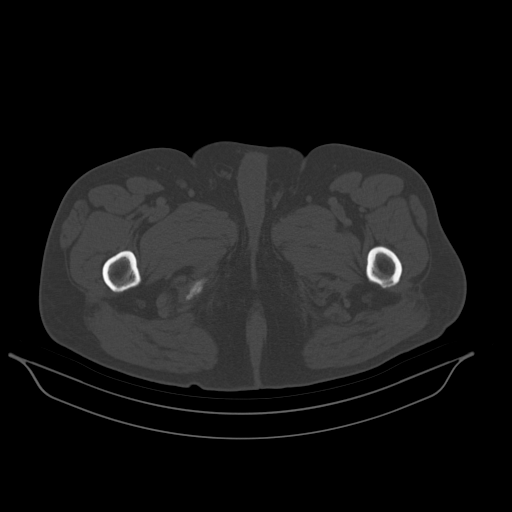
[im 8/62  soft-tissue]
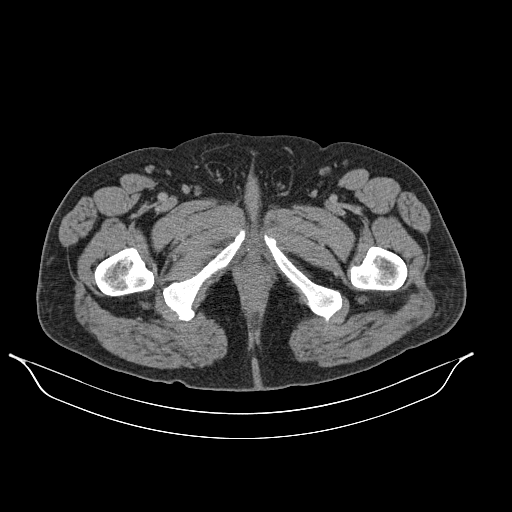
[im 12/62  soft-tissue]
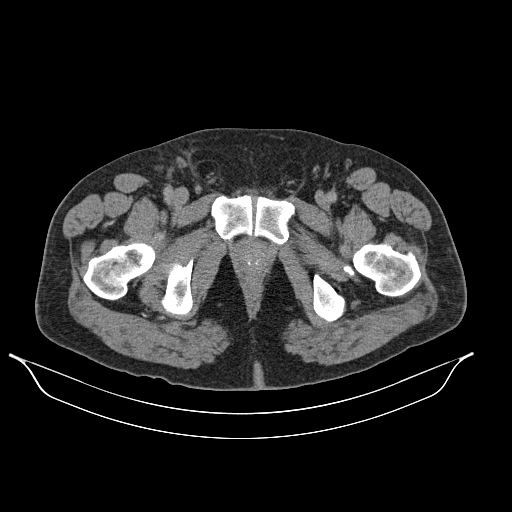
[im 16/62  soft-tissue]
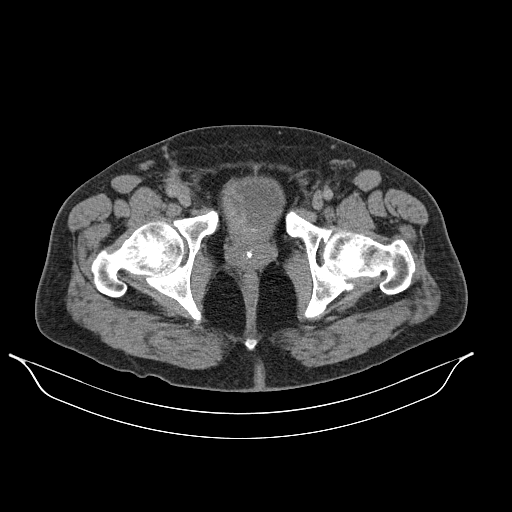
[im 20/62  soft-tissue]
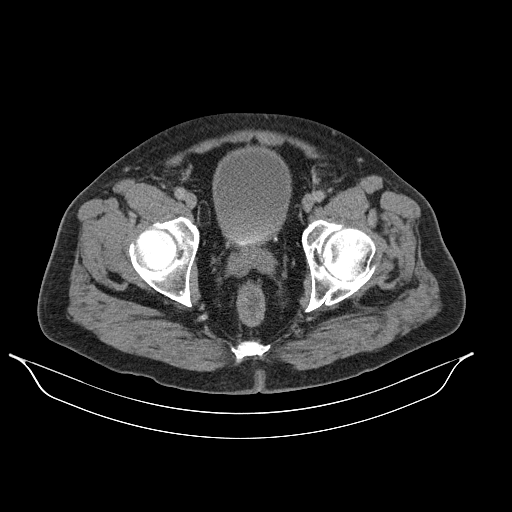
[im 24/62  soft-tissue]
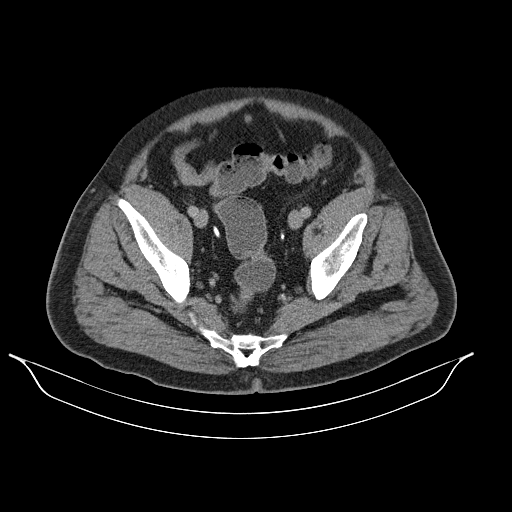
[im 28/62  soft-tissue]
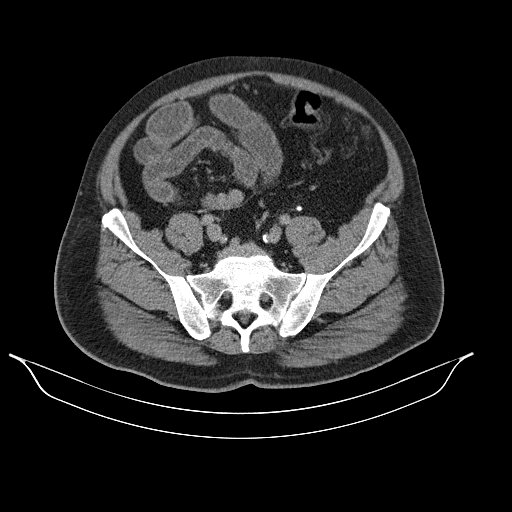
[im 34/62  soft-tissue]
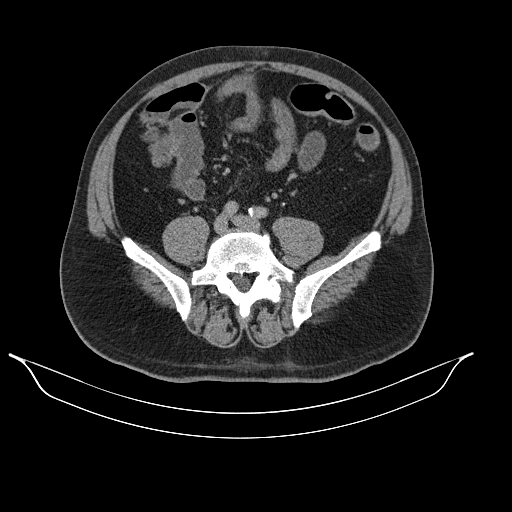
[im 38/62  soft-tissue]
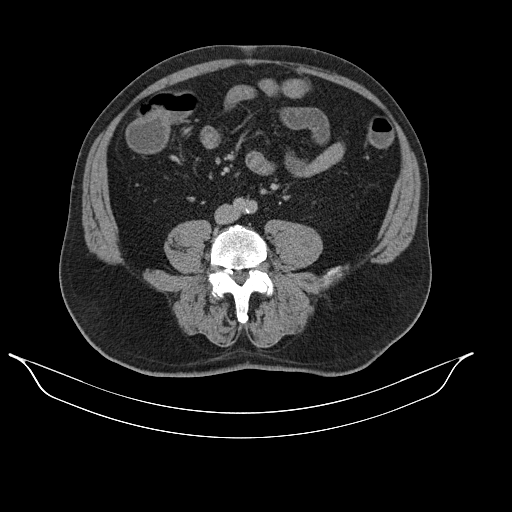
[im 38/62  bone]
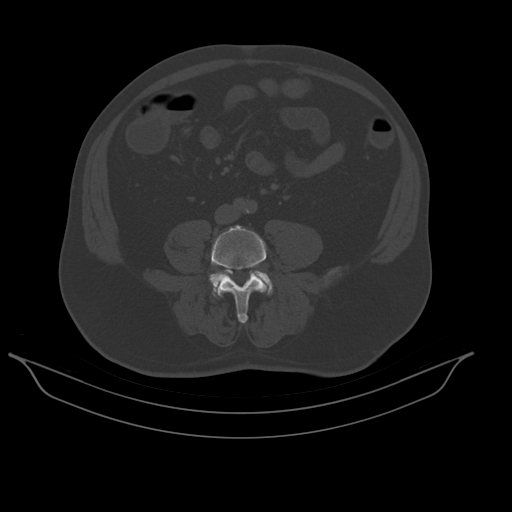
[im 42/62  soft-tissue]
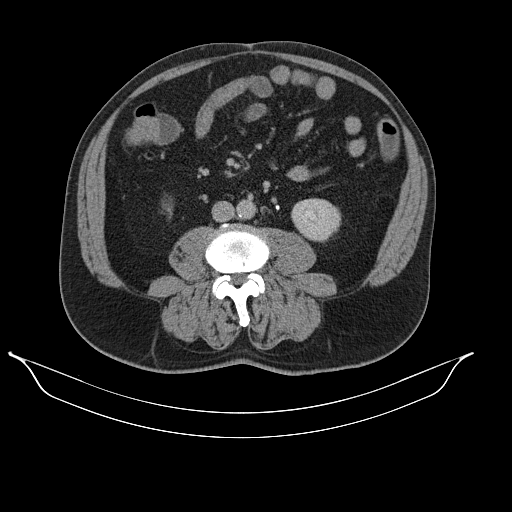
[im 46/62  soft-tissue]
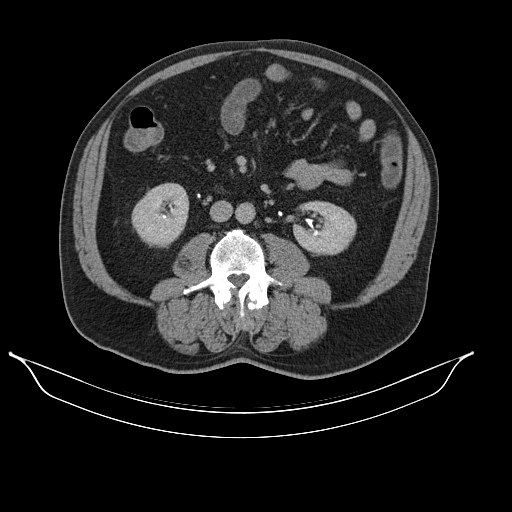
[im 50/62  soft-tissue]
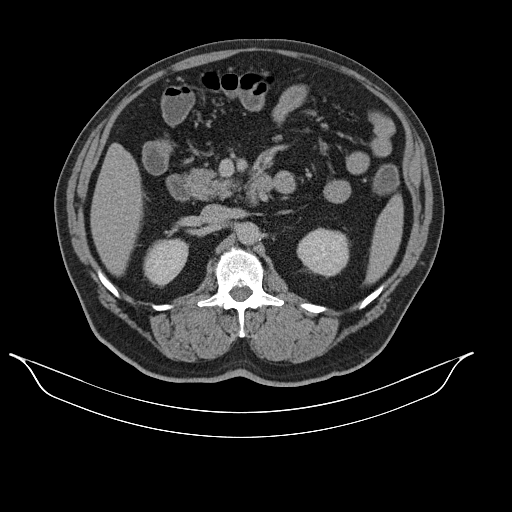
[im 54/62  soft-tissue]
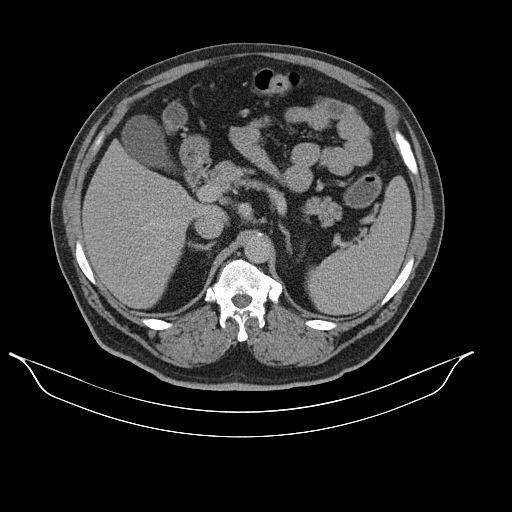
[im 58/62  soft-tissue]
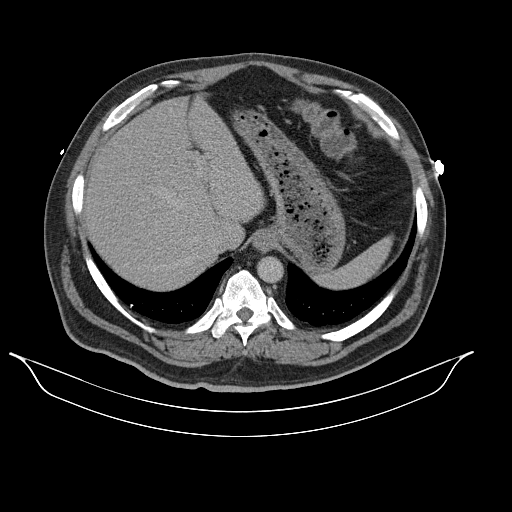

[Series 4: abd/pelvis with 5.0 coronal · coronal · 1.29mm/px · 3 of 6 slices shown]
[im 2/6  soft-tissue]
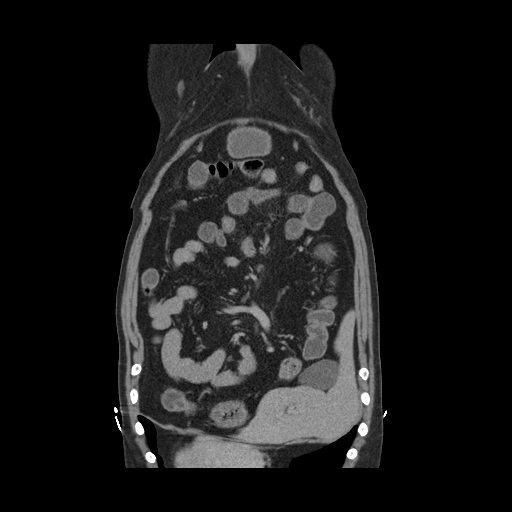
[im 3/6  soft-tissue]
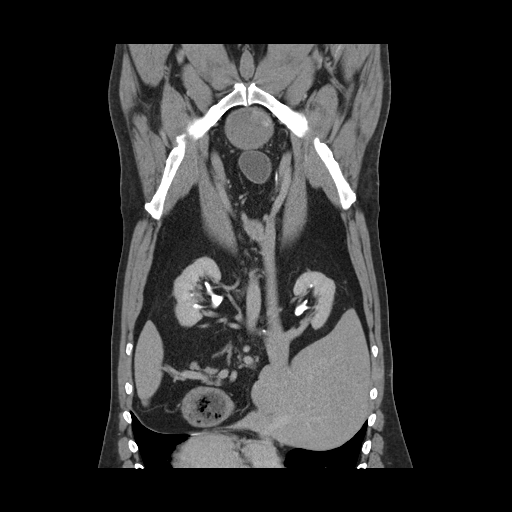
[im 4/6  soft-tissue]
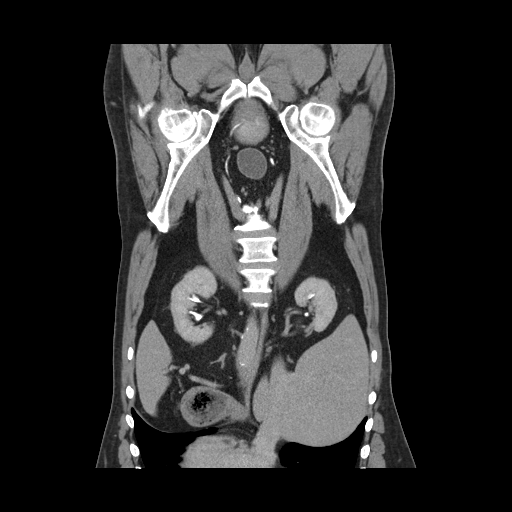

[17 of 42 positions shown; findings below may reference images not displayed]

EXAM
CT abdomen and pelvis with contrast.

INDICATION
abd pain
SYNCOPE. FALL. LEFT HIP PAIN. HX OF STENTS. GFR 80. [id number].

FINDINGS
CT of the abdomen and pelvis was performed after IV dose of 100 cc of low osmolar Omnipaque 300.
All CT scans at this facility use dose modulation, iterative reconstruction, and/or weight based
dosing when appropriate to reduce radiation dose to as low as reasonably achievable.
The lung bases are clear.
There is no focal abnormality of the liver or spleen.
The pancreas, adrenals and kidneys appear normal.
There is no evidence of bowel obstruction.
The appendix is normal.
There is no free fluid within the peritoneal cavity.
The bladder contour is normal. There is mixing of contrast and none opacified urine within the
bladder.
There are a few small prostate calcifications.
There are no acute bone lesions.

IMPRESSION
There is no acute appearing CT abnormality of the abdomen or pelvis.

Tech Notes:

## 2018-08-25 IMAGING — CR PELVIS
4 series · 4 of 4 positions shown · non-contrast
Comparison: none

[pelvis (1 of 2)]
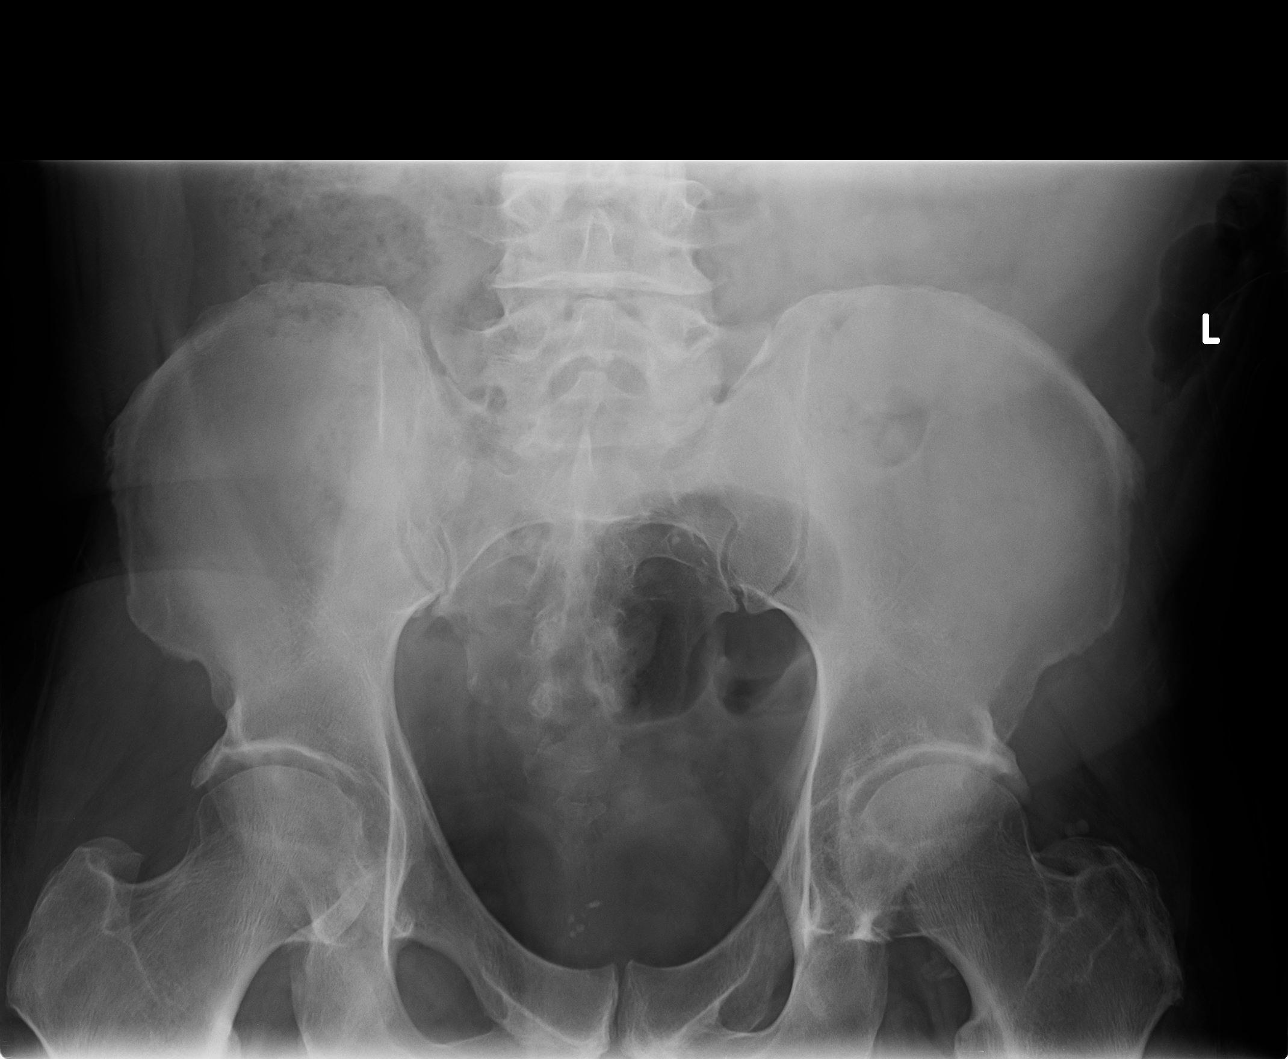

[pelvis (2 of 2)]
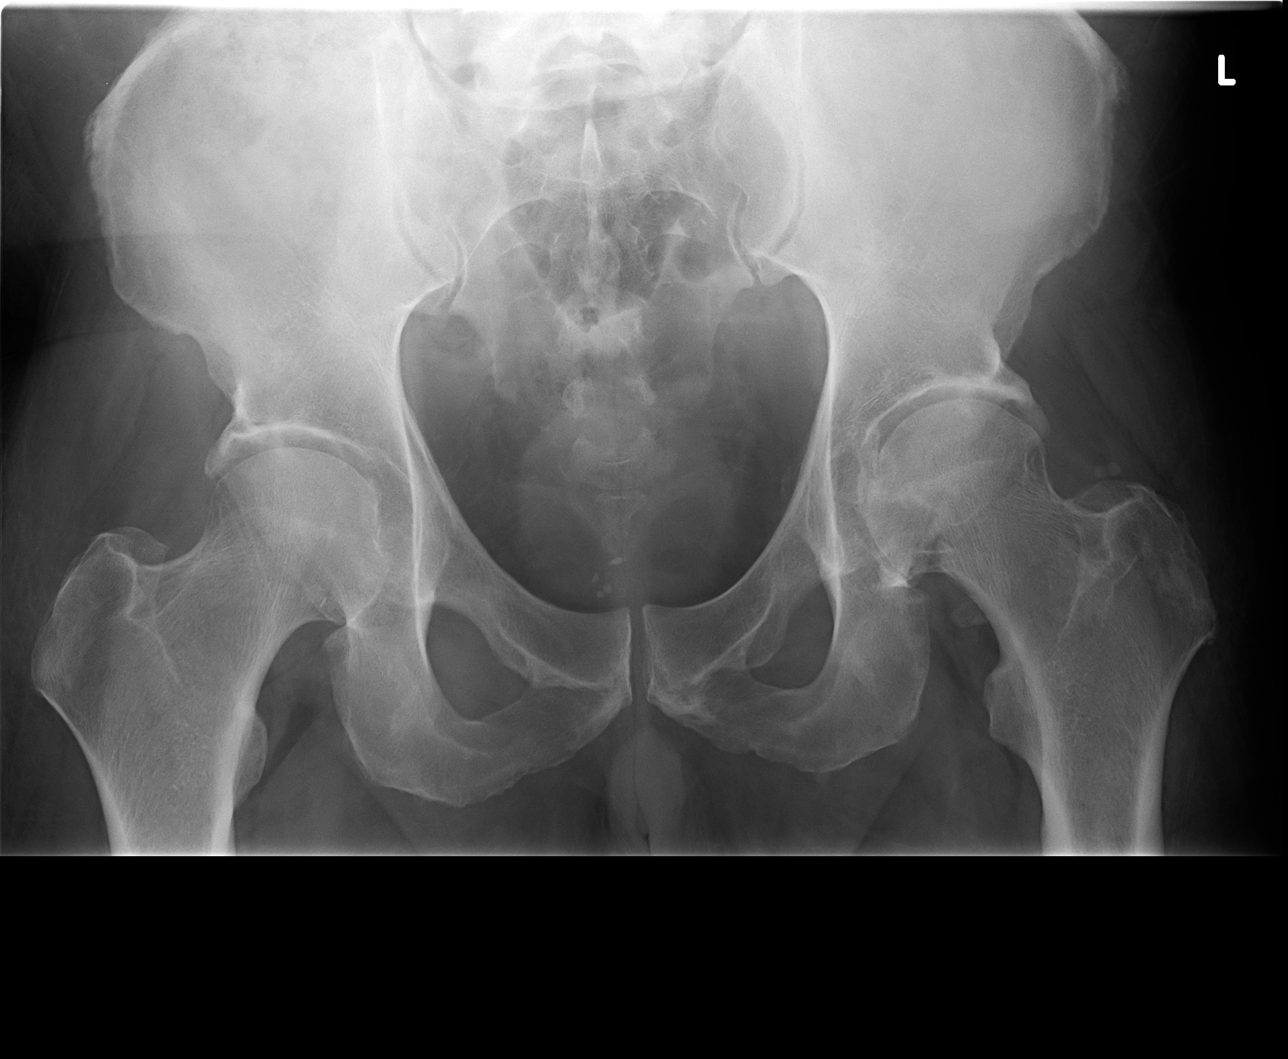

[hip ap]
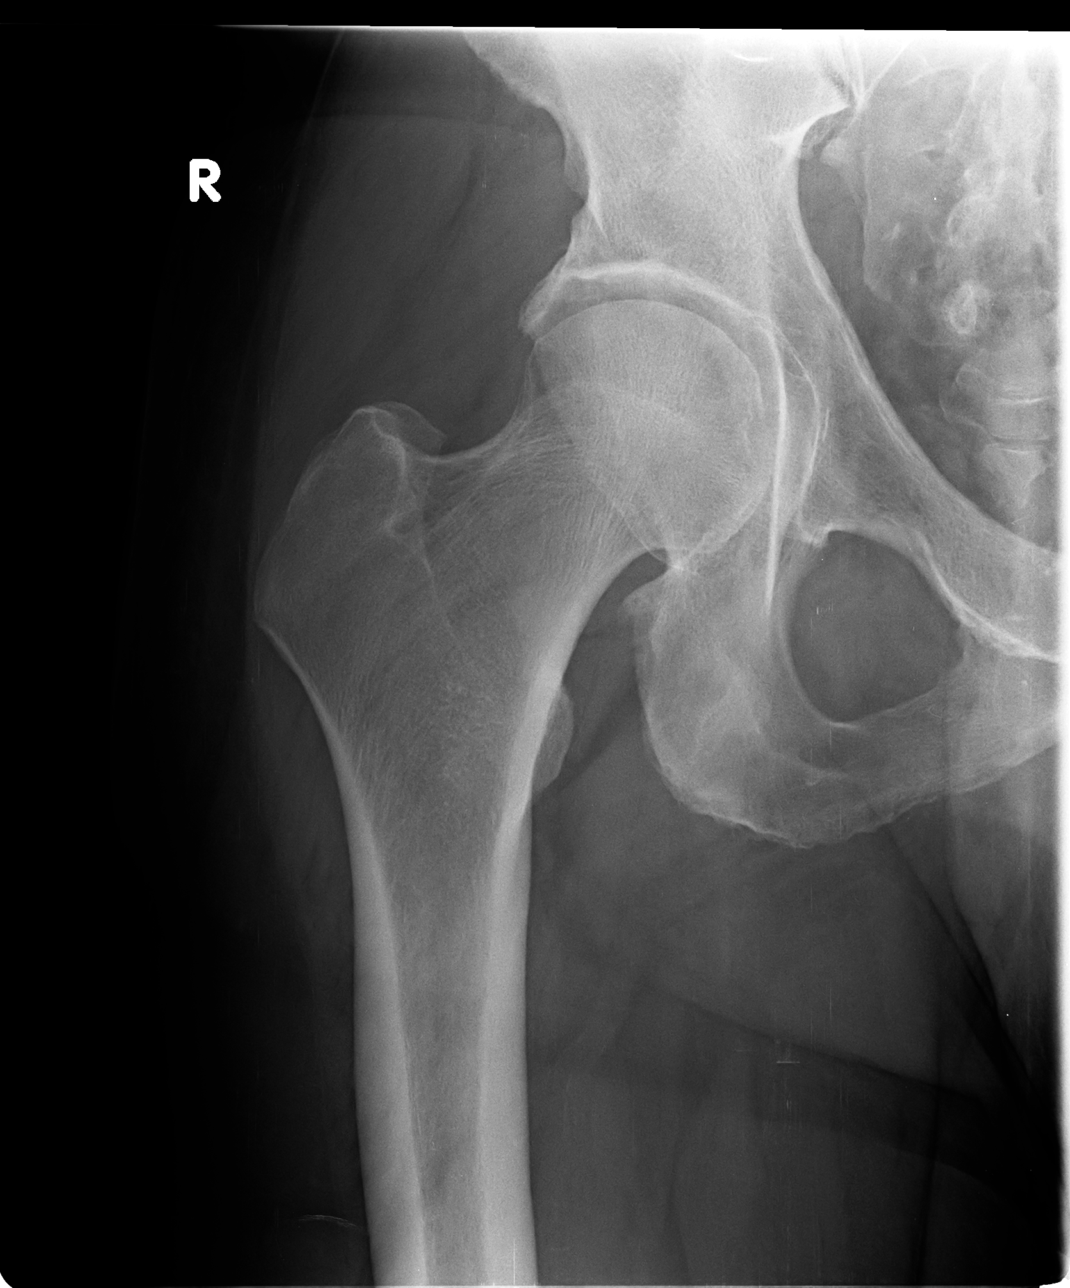

[hip frog]
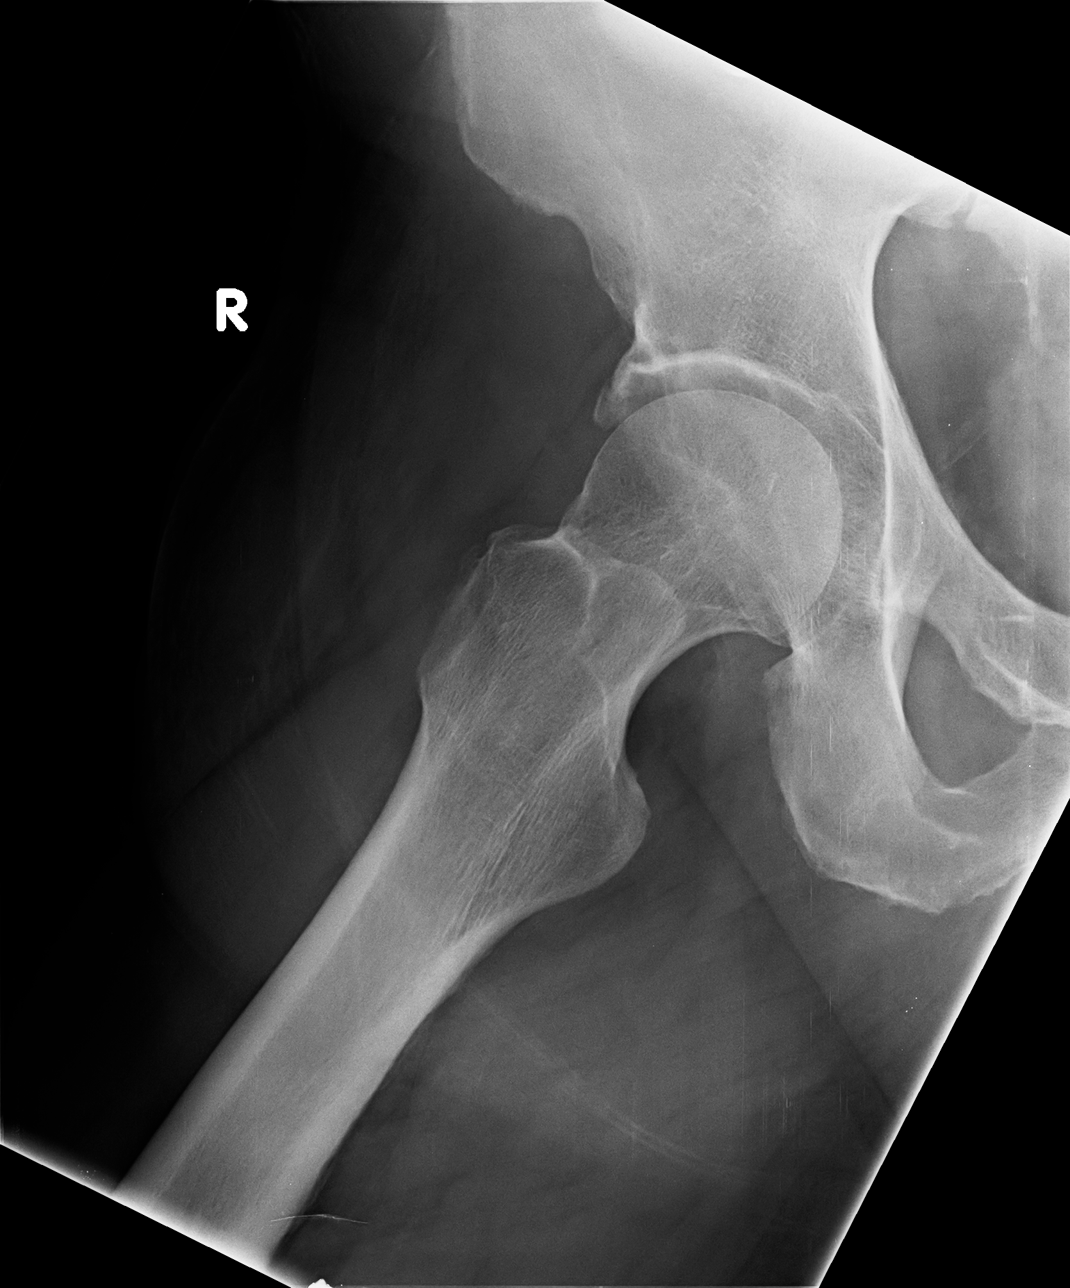

[4 of 4 positions shown; findings below may reference images not displayed]

EXAM

RADIOLOGICAL EXAMINATION, HIP; COMPLETE 2 VIEWS, AP PELVIS ONE VIEW

INDICATION

Right Hip Pain after sustaining a fall  1 month ago. Pain radiates down right leg

TECHNIQUE

AP pelvis and 2 views of the  right hip were acquired.

COMPARISONS

Previous AP pelvis dated 11/11/2016.

FINDINGS

There are no fractures or subluxations of the bony pelvis. The obturator rings appear intact bony
mineralization is within normal limits. Degenerative changes of the right hip joint are
redemonstrated. There is acetabular over coverage. This may predispose the patient to
femoroacetabular impingement. There are no blastic or lytic lesions.

IMPRESSION

There are no acute radiographic abnormalities of the bony pelvis or the right hip. Moderate changes
of the hip joints are present bilaterally. There is evidence of acetabular over coverage. This
predisposes the patient to femoroacetabular impingement. Clinical correlation is advised.

Tech Notes:

having right hip pain after fall 1 month ago. pain radiates down right leg

## 2019-01-14 IMAGING — CT BRAIN WO(Adult)
2 of 4 series · 12 of 47 positions shown, 15 images · non-contrast
Comparison: No relevant prior studies available.
COMPARISON: No relevant prior studies available.

------------- REPORT [id number] -------------
DIAGNOSTIC STUDIES

EXAM:  CT HEAD WITHOUT INTRAVENOUS CONTRAST  (95491)
INDICATION: persisting dizzyness Pt c/o persisting dizziness x 2 weeks. CT/NM 0/0. CC/JC ATH
TECHNIQUE: Axial computed tomography images of the head/brain without intravenous contrast.

[Series 2: brain ax 5.00 hr40 s3 · axial · 0.36mm/px · z∈[-527,-388]mm · 9 of 34 slices shown, 12 images]
[im 3/34  brain]
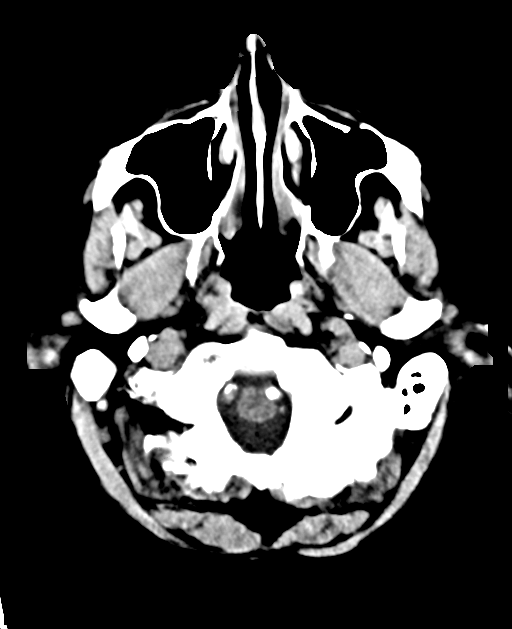
[im 3/34  bone]
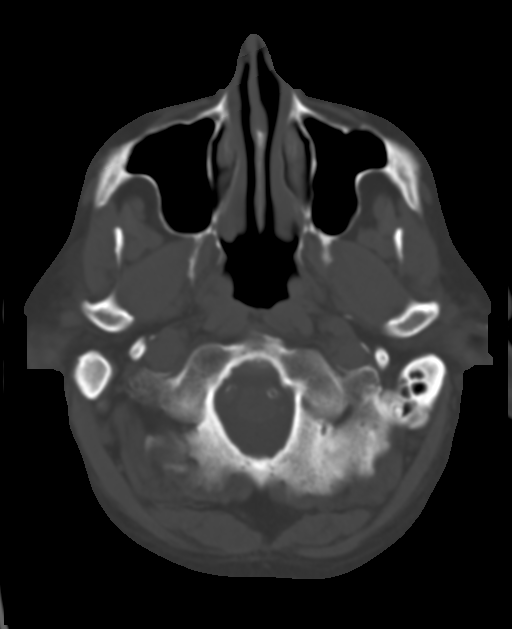
[im 8/34  brain]
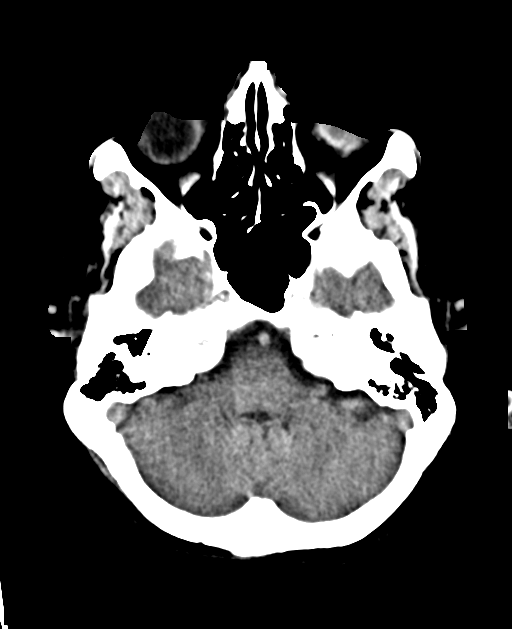
[im 10/34  brain]
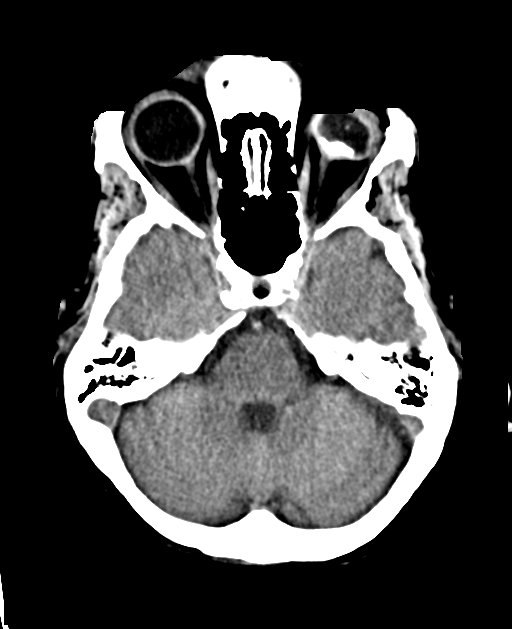
[im 15/34  brain]
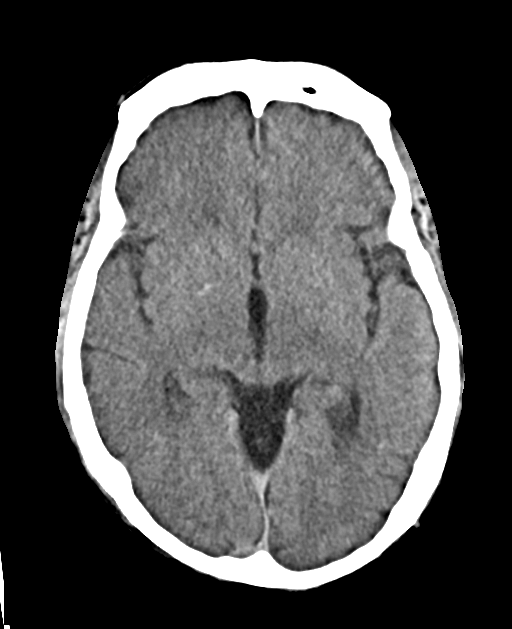
[im 17/34  brain]
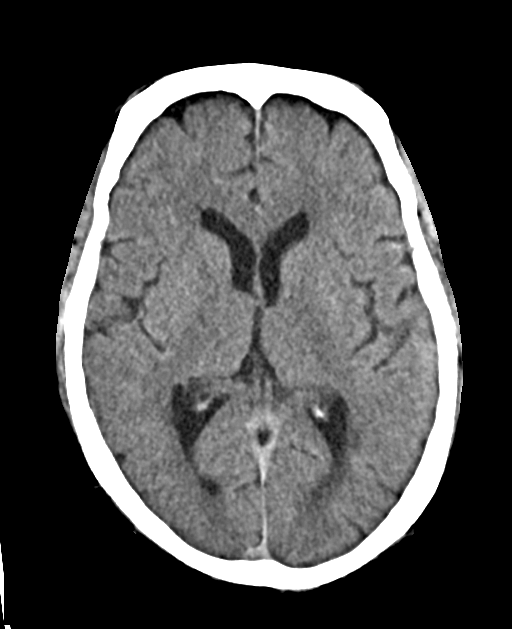
[im 17/34  bone]
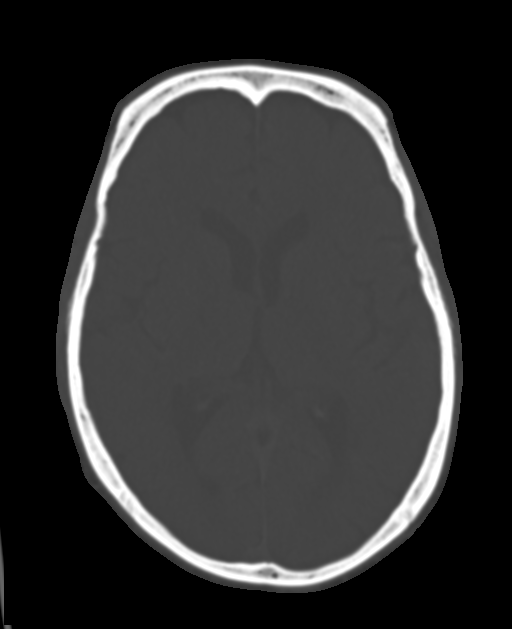
[im 19/34  brain]
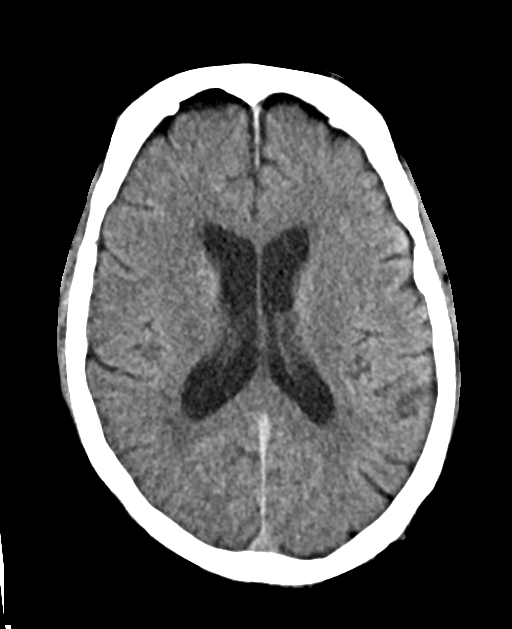
[im 24/34  brain]
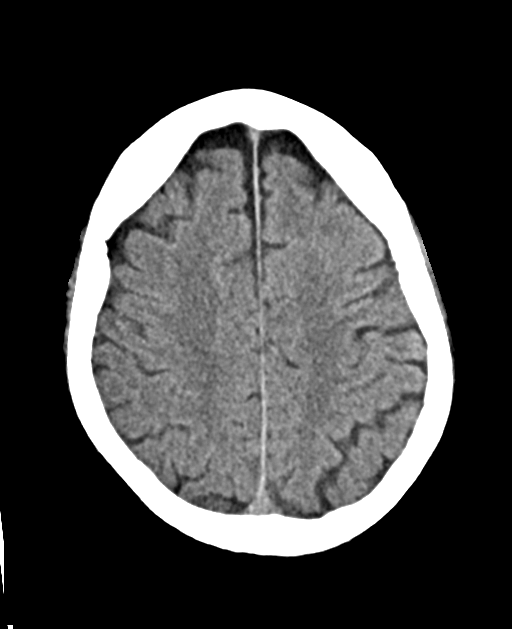
[im 26/34  brain]
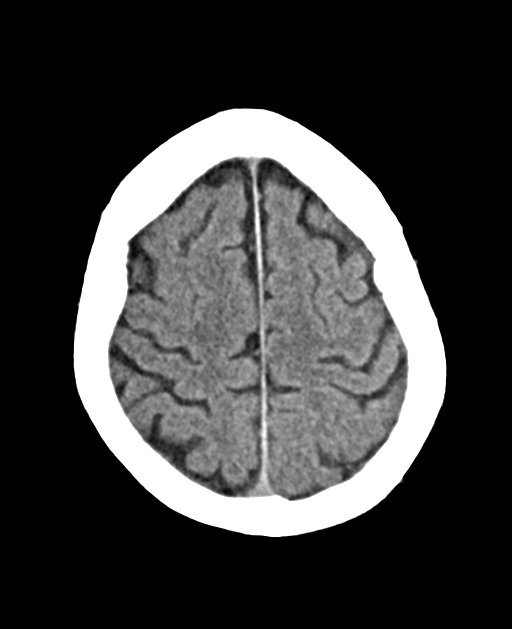
[im 31/34  brain]
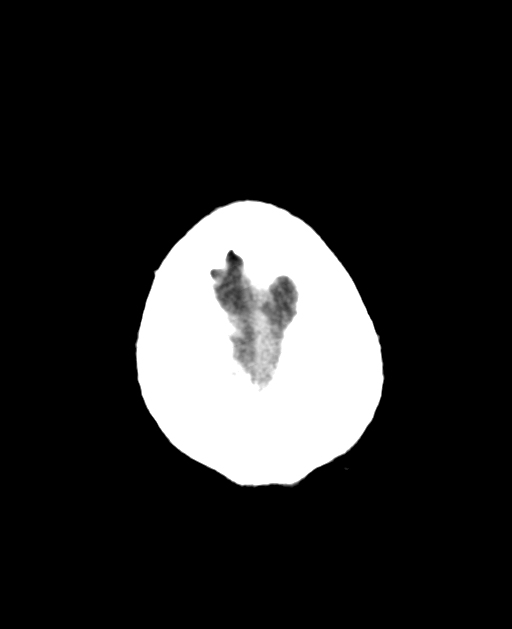
[im 31/34  bone]
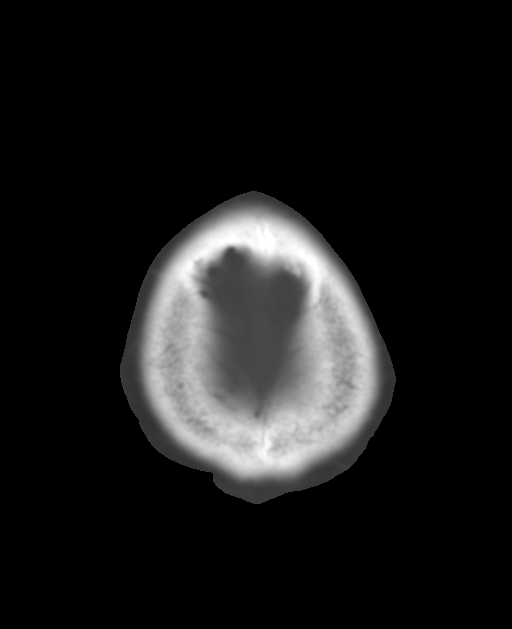

[Series 4: brain cor 5.00 hr40 s3 · coronal · 0.34mm/px · 3 of 43 slices shown]
[im 15/43  brain]
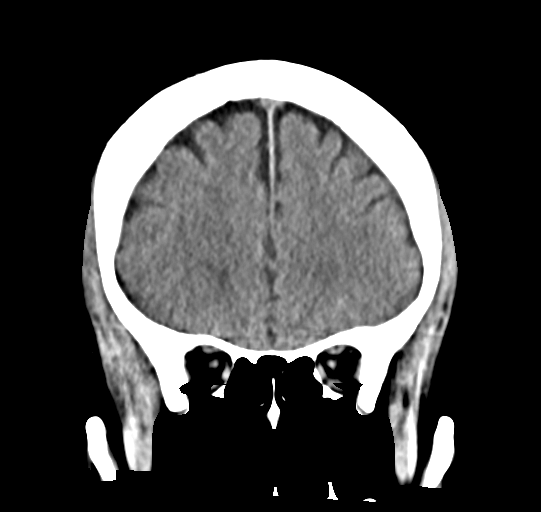
[im 19/43  brain]
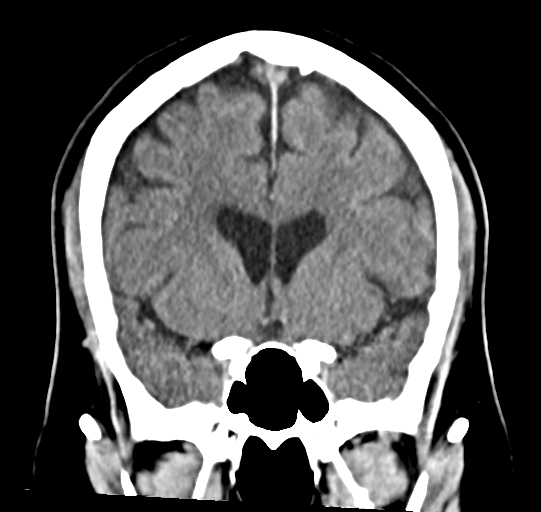
[im 24/43  brain]
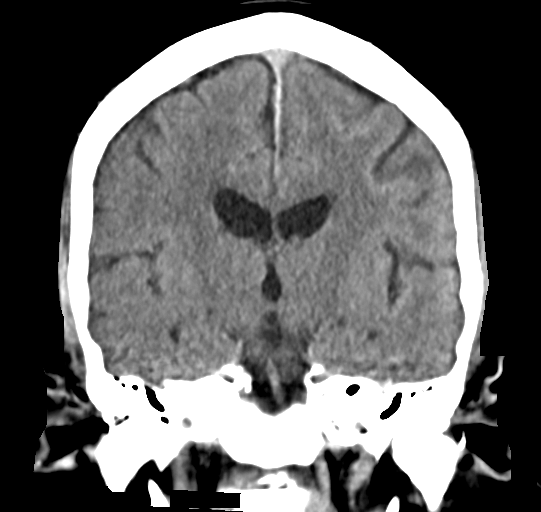

[12 of 47 positions shown; findings below may reference images not displayed]

All CT scans at this facility use dose modulation, interval reconstruction, and/or weight-based
dosing when appropriate to reduce radiation dose to as low as reasonably achievable.  Sagittal and
coronal reformatted images were created and reviewed.

Number of previous computed tomography exams in the last 12 months is 0.

Number of previous nuclear medicine myocardial perfusion studies in the last 12 months is 0.
FINDINGS: BRAIN:  Minimal parenchymal volume loss. Calcifications in the basal ganglia

NO evidence of hemorrhage or mass.

VENTRICLES:  Minimal enlargement of the cerebral ventricles. The age of this finding is
indeterminate however this does not appear to be acute.

BONES/JOINTS:  No acute findings.  No acute fracture.

SOFT TISSUES:  No acute findings.

VASCULATURE:  Vascular calcification.

SINUSES:  Unremarkable as visualized.

MASTOID AIR CELLS:  Unremarkable as visualized.  No mastoid effusion.

ORBITS:  Chronic changes in the LEFT globe and orbit. Please correlate.
IMPRESSION: - NO evidence of hemorrhage or mass.

Tech Notes:

Pt c/o persisting dizziness x 2 weeks. CT/NM 0/0. CC/JC

------------- REPORT [id number] -------------
DIAGNOSTIC STUDIES

EXAM:  CT HEAD WITHOUT INTRAVENOUS CONTRAST  (95491)
All CT scans at this facility use dose modulation, interval reconstruction, and/or weight-based
dosing when appropriate to reduce radiation dose to as low as reasonably achievable.  Sagittal and
coronal reformatted images were created and reviewed.

Number of previous computed tomography exams in the last 12 months is 0.

Number of previous nuclear medicine myocardial perfusion studies in the last 12 months is 0.
FINDINGS: BRAIN:  Minimal parenchymal volume loss. Calcifications in the basal ganglia

NO evidence of hemorrhage or mass.

VENTRICLES:  Minimal enlargement of the cerebral ventricles. The age of this finding is
indeterminate however this does not appear to be acute.

BONES/JOINTS:  No acute findings.  No acute fracture.

SOFT TISSUES:  No acute findings.

VASCULATURE:  Vascular calcification.

SINUSES:  Unremarkable as visualized.

MASTOID AIR CELLS:  Unremarkable as visualized.  No mastoid effusion.

ORBITS:  Chronic changes in the LEFT globe and orbit. Please correlate.
IMPRESSION: - NO evidence of hemorrhage or mass.

Tech Notes:

Pt c/o persisting dizziness x 2 weeks. CT/NM 0/0. CC/JC

## 2019-01-15 IMAGING — MR Head^Brain
1 series · 48 of 48 positions shown · non-contrast
Comparison: none

[Series 4: cow · axial · 0.8mm · 0.56mm/px · z∈[-68,+2]mm · 48 of 90 slices shown]
[im 1/90]
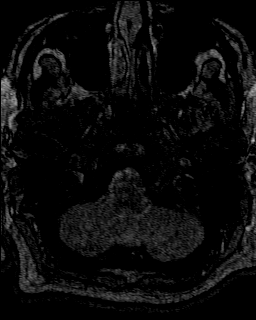
[im 2/90]
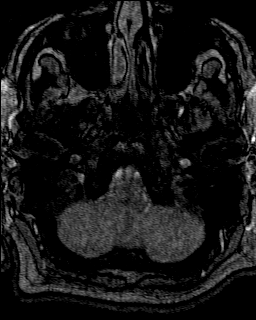
[im 4/90]
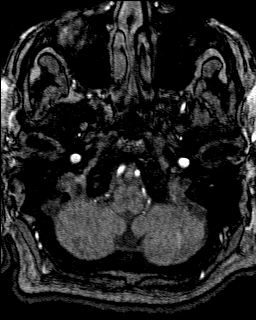
[im 6/90]
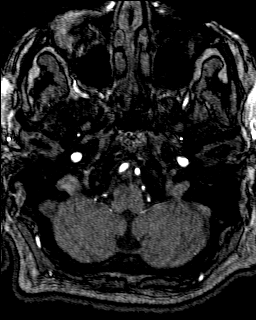
[im 8/90]
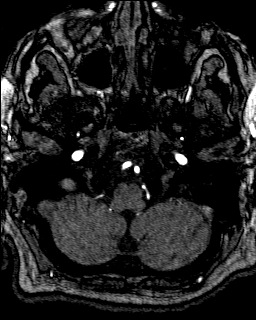
[im 10/90]
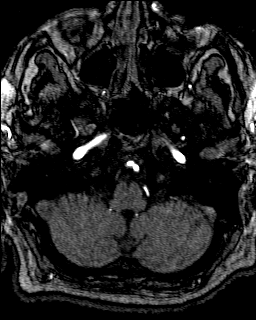
[im 12/90]
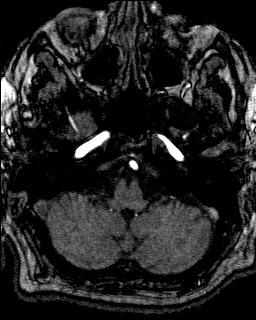
[im 14/90]
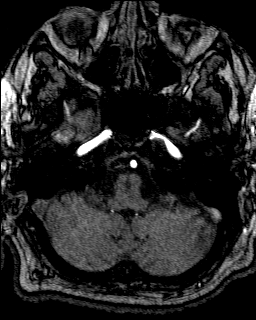
[im 16/90]
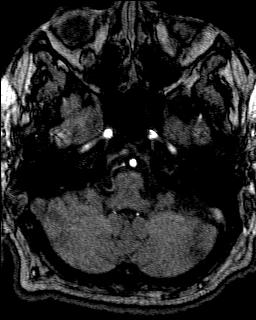
[im 18/90]
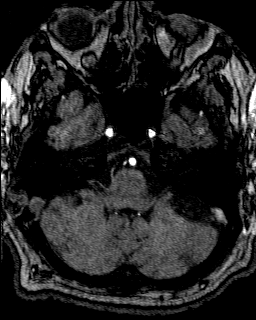
[im 19/90]
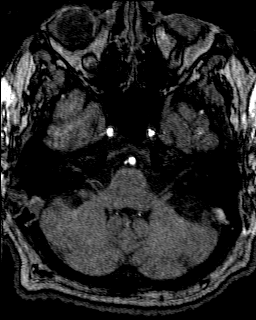
[im 21/90]
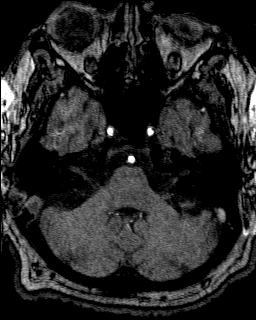
[im 23/90]
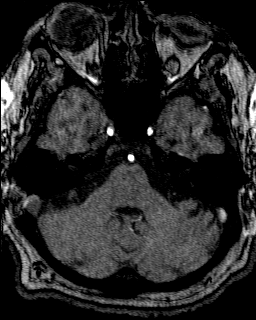
[im 25/90]
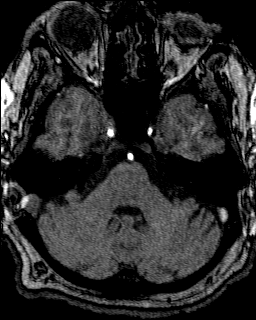
[im 27/90]
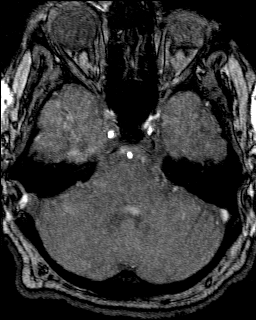
[im 29/90]
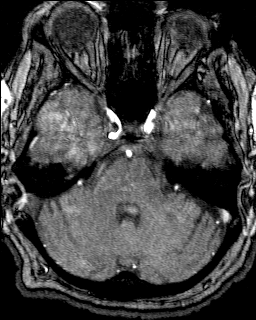
[im 31/90]
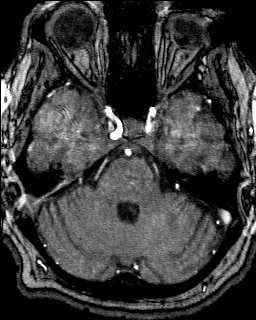
[im 33/90]
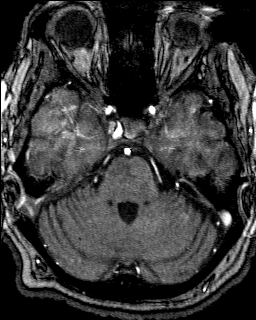
[im 35/90]
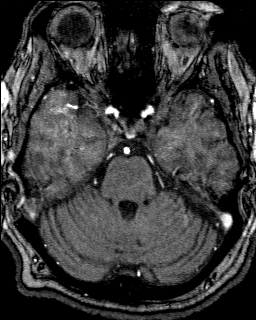
[im 36/90]
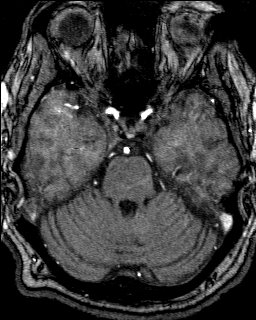
[im 38/90]
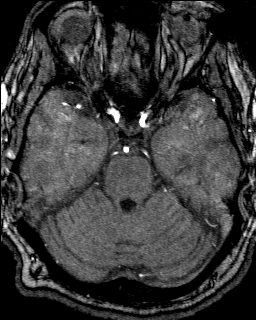
[im 40/90]
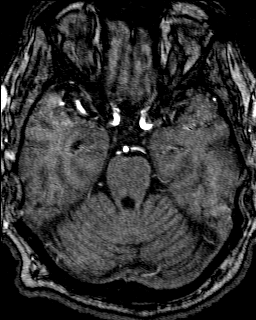
[im 42/90]
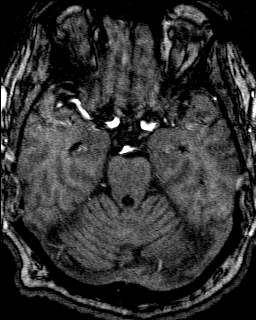
[im 44/90]
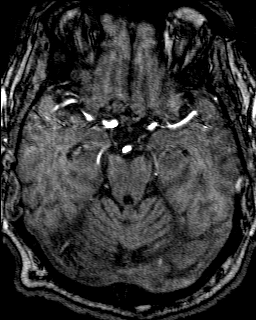
[im 46/90]
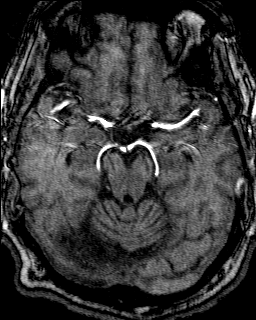
[im 48/90]
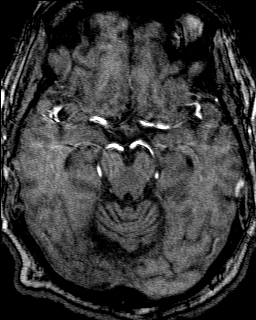
[im 50/90]
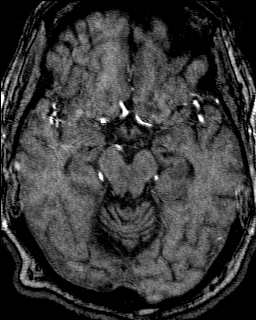
[im 52/90]
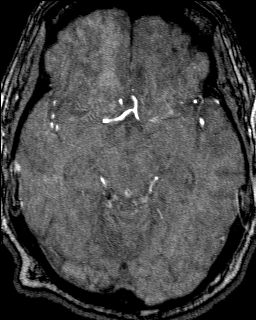
[im 54/90]
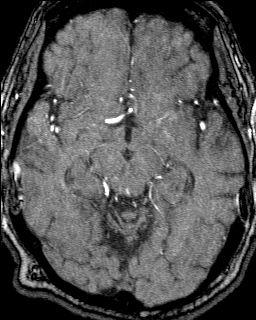
[im 55/90]
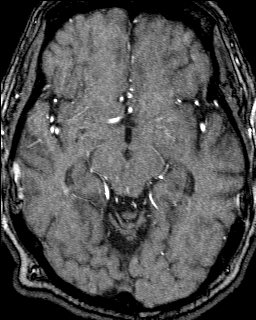
[im 57/90]
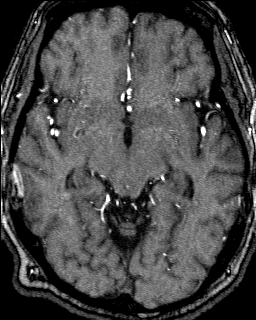
[im 59/90]
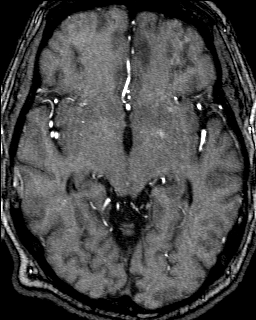
[im 61/90]
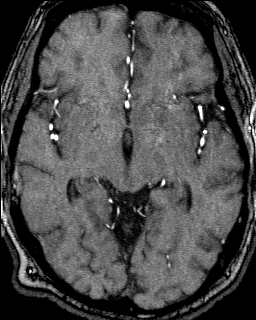
[im 63/90]
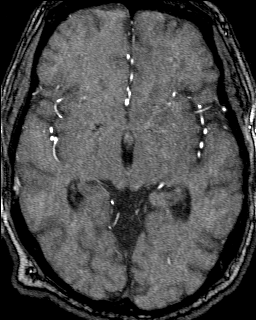
[im 65/90]
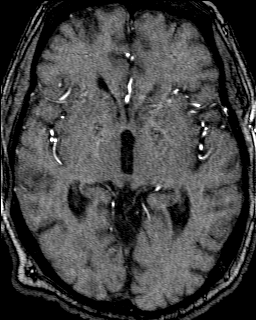
[im 67/90]
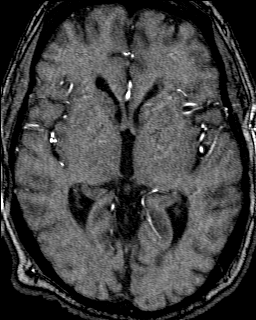
[im 69/90]
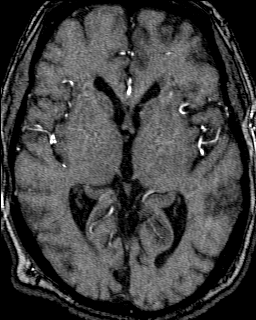
[im 71/90]
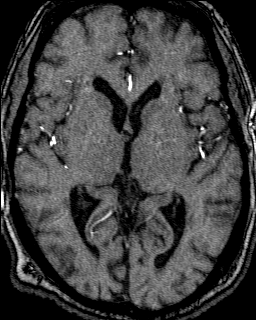
[im 72/90]
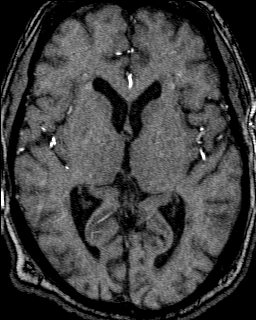
[im 74/90]
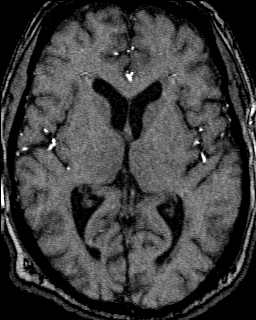
[im 76/90]
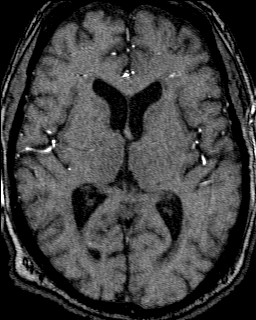
[im 78/90]
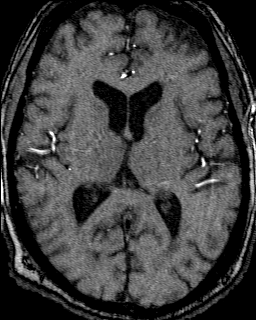
[im 80/90]
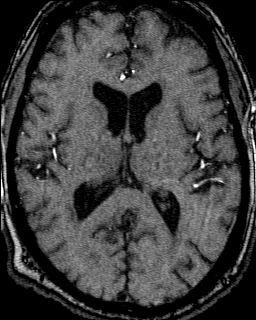
[im 82/90]
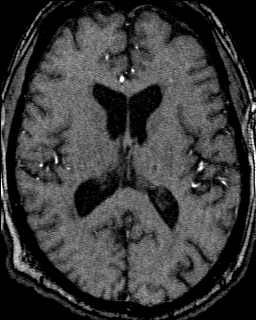
[im 84/90]
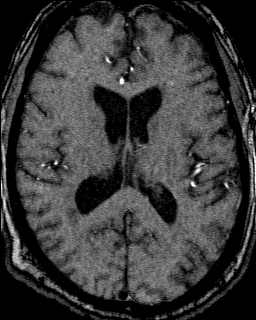
[im 86/90]
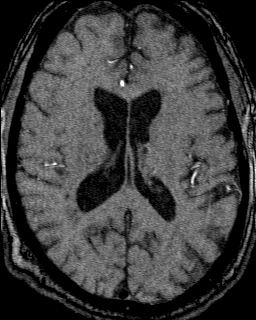
[im 88/90]
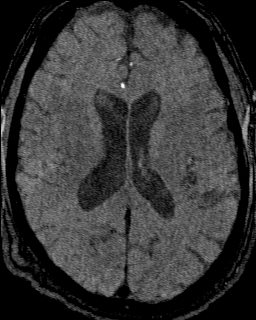
[im 90/90]
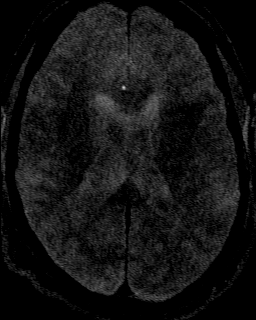

[48 of 48 positions shown; findings below may reference images not displayed]

------------- REPORT [id number] -------------
DIAGNOSTIC STUDIES

EXAM

3D MR angiography of the brain.

INDICATION

DIZZINESS
DIZZINESS, CONFUSION, NAUSEA, ACUTE ONSET.  STABBING HEADACHES WITH DIFFICULTY WALKING.  RG

TECHNIQUE

3D time-of-flight MR angiography was obtained.

COMPARISONS

None available

FINDINGS

There is normal flow throughout the vertebral and basilar arteries. Normal flow is noted
throughout the visualized cavernous carotid arteries. There is no evidence for aneurysm or vascular
malformation. Normal flow can be identified within the posterior, middle, and anterior cerebral
arteries in their visualized aspects. The anterior communicating artery is normal. The posterior
communicating are developmentally small.

IMPRESSION

Normal MR angiography of the circle of Willis.

Tech Notes:

DIZZINESS, CONFUSION, NAUSEA, ACUTE ONSET.  STABBING HEADACHES WITH DIFFICULTY WALKING.  RG

------------- REPORT [id number] -------------
DIAGNOSTIC STUDIES

EXAM

3D MR angiography of the brain.

INDICATION

DIZZINESS
DIZZINESS, CONFUSION, NAUSEA, ACUTE ONSET.  STABBING HEADACHES WITH DIFFICULTY WALKING.  RG

TECHNIQUE

3D time-of-flight MR angiography was obtained.

COMPARISONS

None available

FINDINGS

There is normal flow throughout the vertebral and basilar arteries. Normal flow is noted
throughout the visualized cavernous carotid arteries. There is no evidence for aneurysm or vascular
malformation. Normal flow can be identified within the posterior, middle, and anterior cerebral
arteries in their visualized aspects. The anterior communicating artery is normal. The posterior
communicating are developmentally small.

IMPRESSION

Normal MR angiography of the circle of Willis.

Tech Notes:

DIZZINESS, CONFUSION, NAUSEA, ACUTE ONSET.  STABBING HEADACHES WITH DIFFICULTY WALKING.  RG
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
## 2019-01-15 IMAGING — US CARDUPBI
1 series · 13 of 16 positions shown · non-contrast
Comparison: none

[Series 1: us carotid duplex bi · 13 of 65 slices shown]
[im 1/65]
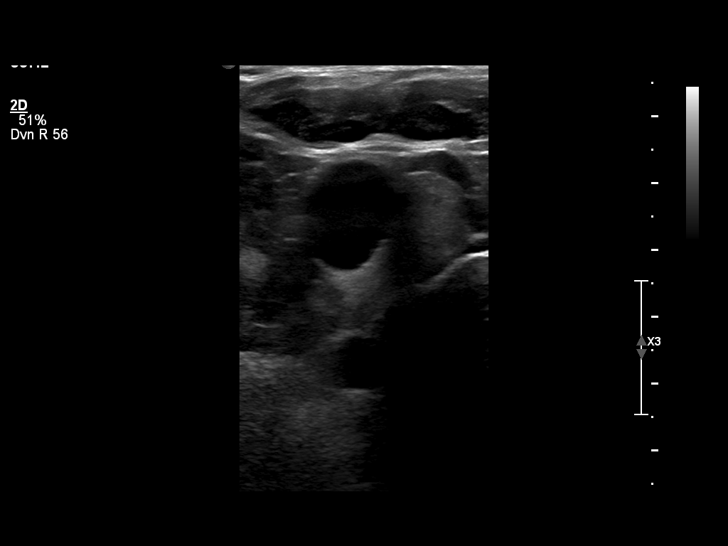
[im 5/65]
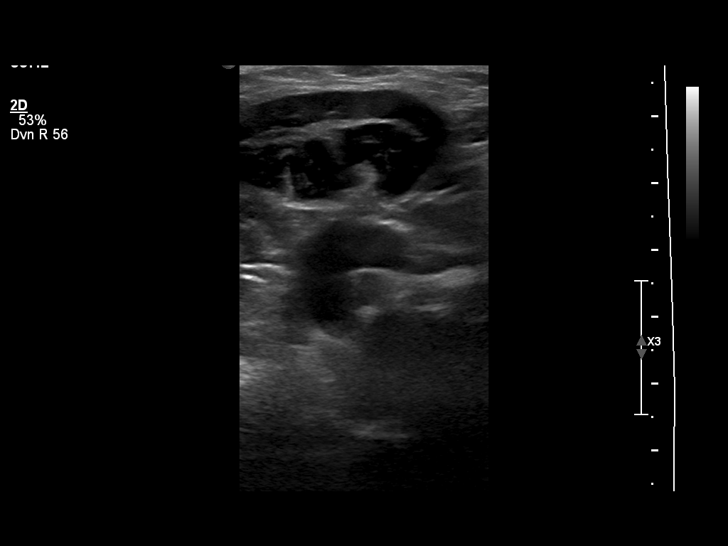
[im 13/65]
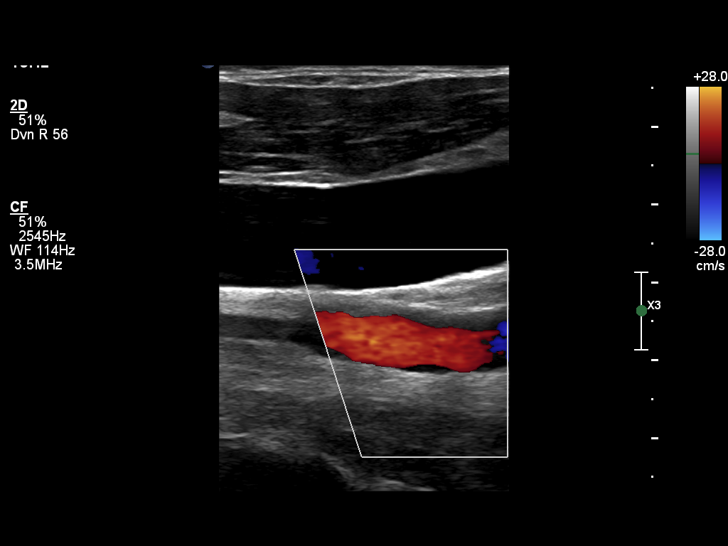
[im 18/65]
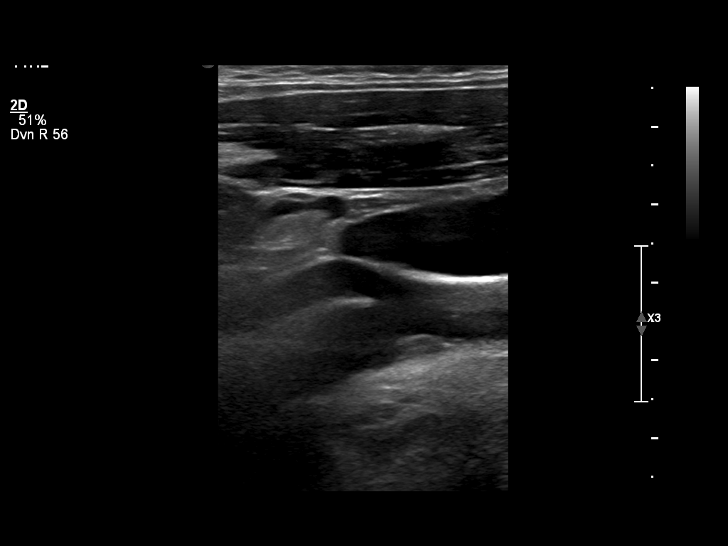
[im 22/65]
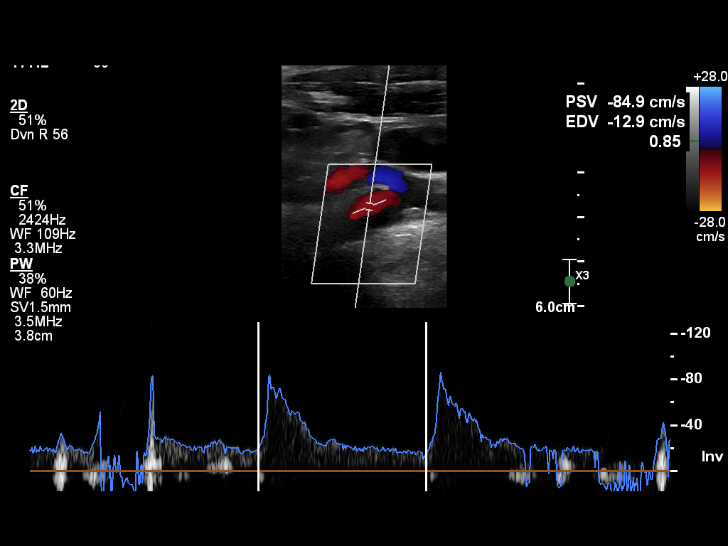
[im 26/65]
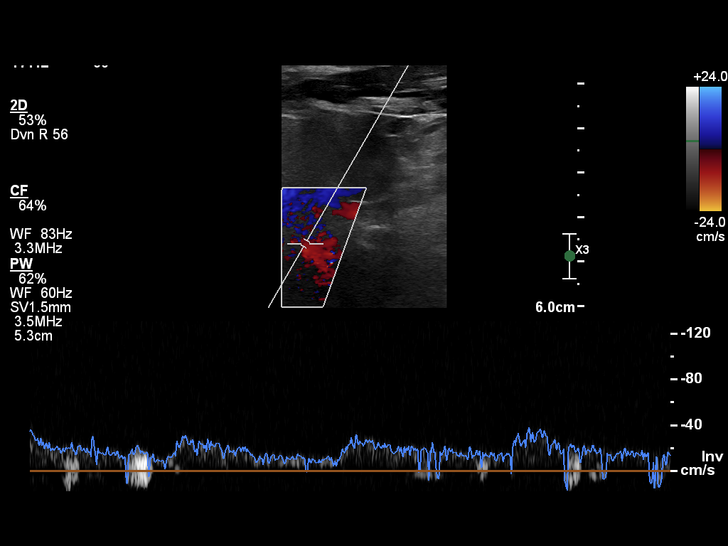
[im 35/65]
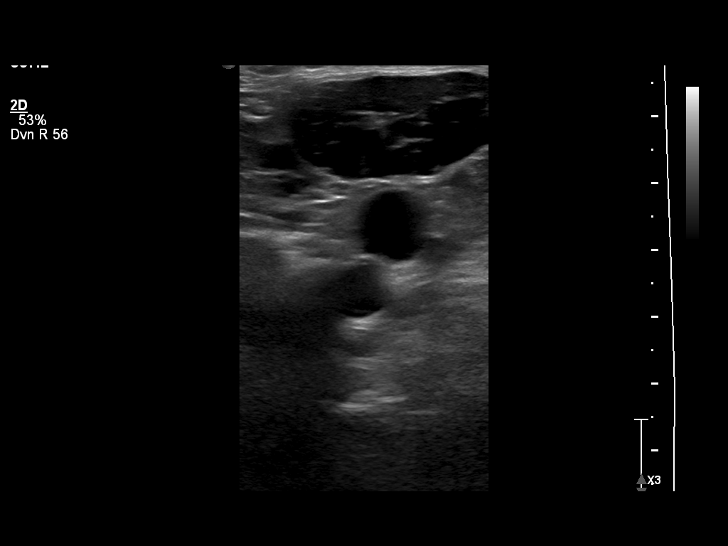
[im 39/65]
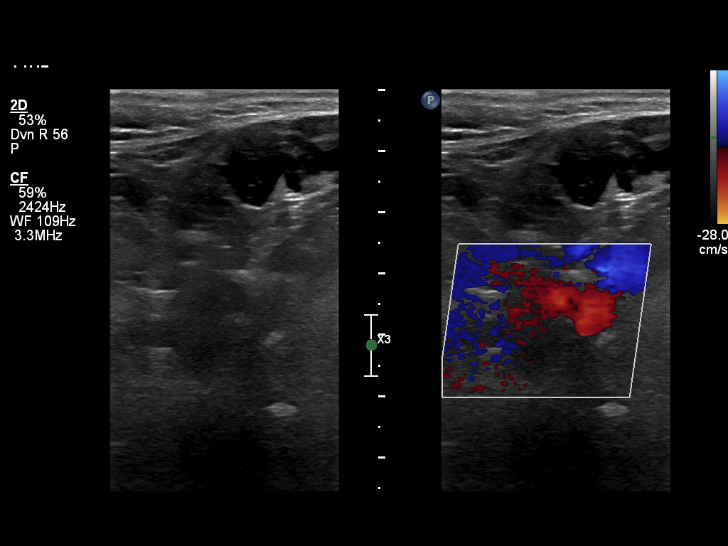
[im 43/65]
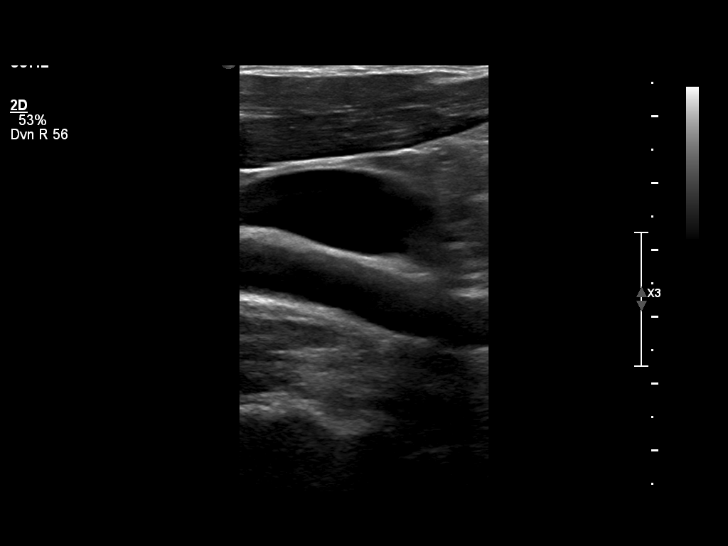
[im 47/65]
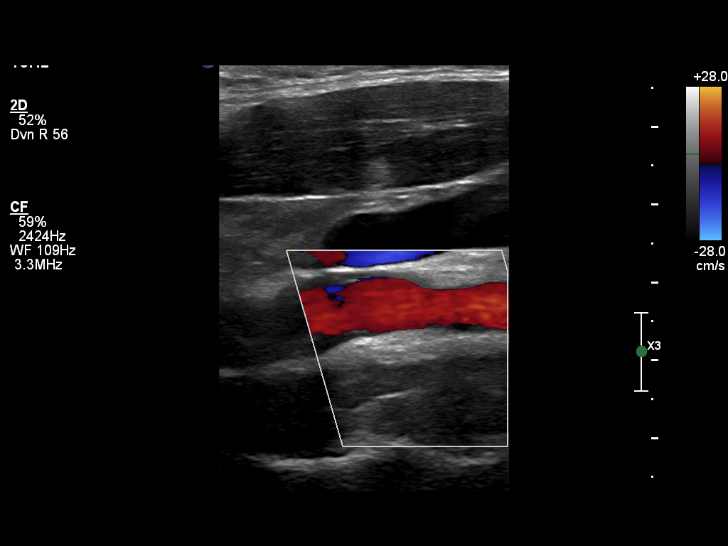
[im 52/65]
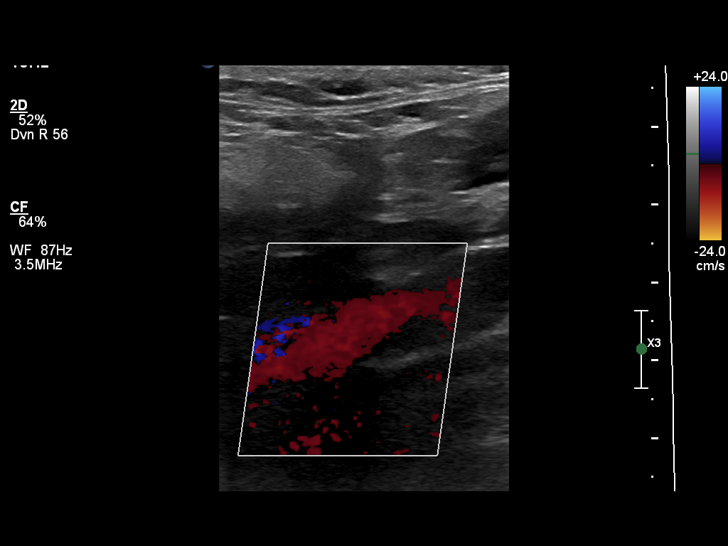
[im 60/65]
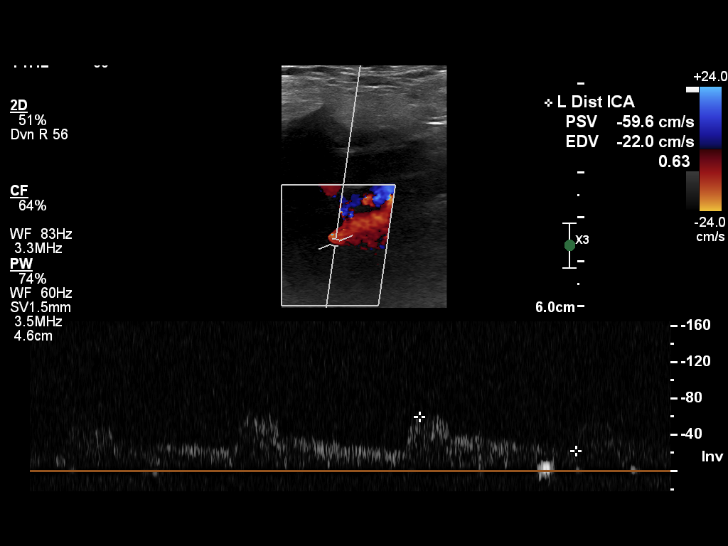
[im 65/65]
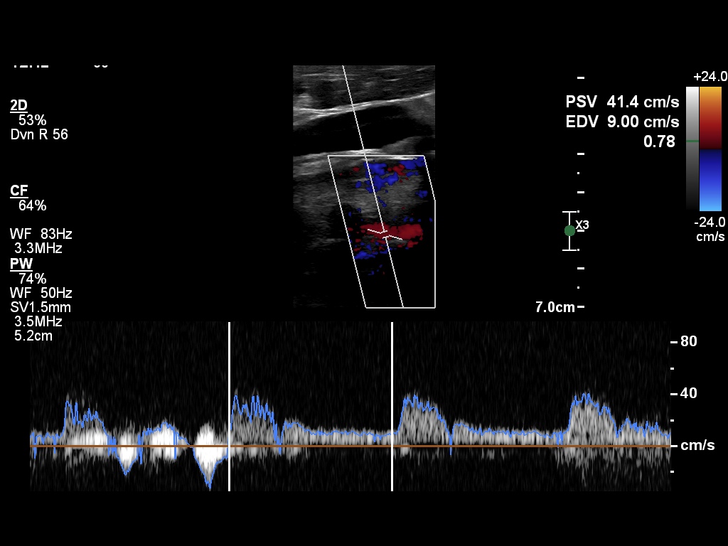

[13 of 16 positions shown; findings below may reference images not displayed]

------------- REPORT [id number] -------------
DIAGNOSTIC STUDIES

EXAM

Carotid Doppler ultrasound.

INDICATION

dizzyness, ataxia

TECHNIQUE

High-resolution duplex imaging of the accessible cervical carotid arteries was.

COMPARISONS

None available

FINDINGS

Right carotid artery:Peak systolic velocity in the distal right common carotid artery is
meters/seconds.
There is moderate narrowing of the right external carotid artery.The right internal carotid artery
demonstrates no elevated systolic velocities.Antegrade flow is identified the right vertebral.

Left carotid artery:
Peak systolic velocity in the distal left common carotid artery is 0.68 meters/seconds.
Left external carotid artery is normal.The left internal carotid artery is normal.Antegrade flow is
identified left vertebral.

IMPRESSION

No evidence for significant stenosis throughout the common or internal carotid arteries
bilaterally.

Moderate narrowing of the right external carotid artery.

Tech Notes:

------------- REPORT [id number] -------------
DIAGNOSTIC STUDIES

EXAM

Carotid Doppler ultrasound.

INDICATION

dizzyness, ataxia

TECHNIQUE

High-resolution duplex imaging of the accessible cervical carotid arteries was.

COMPARISONS

None available

FINDINGS

Right carotid artery:Peak systolic velocity in the distal right common carotid artery is
meters/seconds.
There is moderate narrowing of the right external carotid artery.The right internal carotid artery
demonstrates no elevated systolic velocities.Antegrade flow is identified the right vertebral.

Left carotid artery:
Peak systolic velocity in the distal left common carotid artery is 0.68 meters/seconds.
Left external carotid artery is normal.The left internal carotid artery is normal.Antegrade flow is
identified left vertebral.

IMPRESSION

No evidence for significant stenosis throughout the common or internal carotid arteries
bilaterally.

Moderate narrowing of the right external carotid artery.

Tech Notes:

## 2019-01-16 IMAGING — US ECHOCOMPL
1 series · 14 of 24 positions shown · non-contrast
Comparison: none

[Series 1: us echo 2d, wo/w m-mode, compl · 82 acquisitions, 14 frames shown]
[im 1/82]
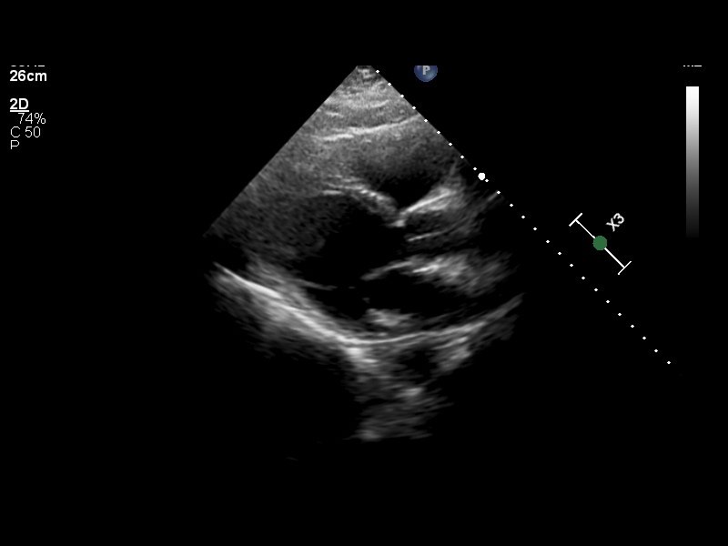
[im 8/82]
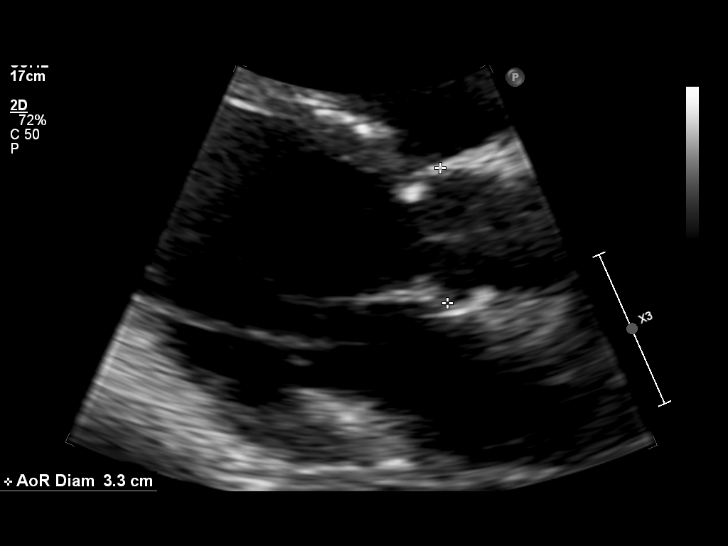
[im 15/82]
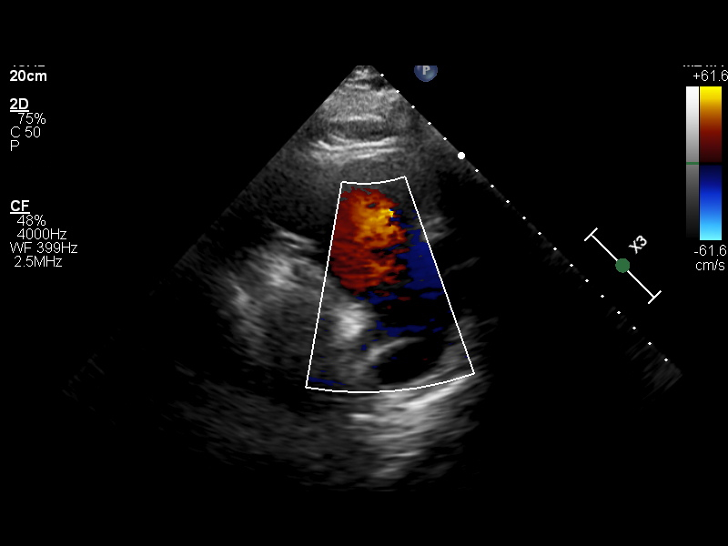
[im 22/82]
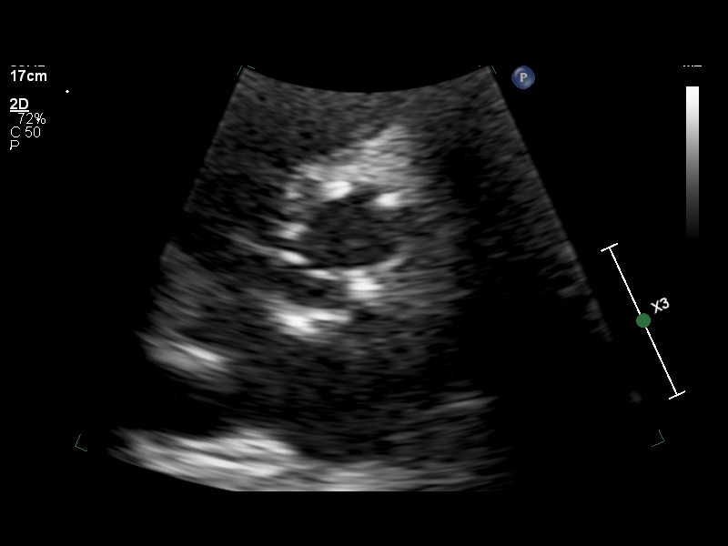
[im 25/82]
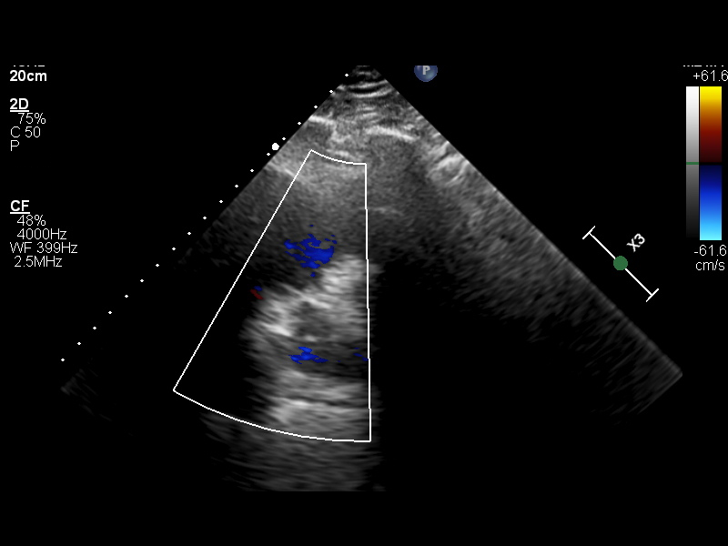
[im 32/82]
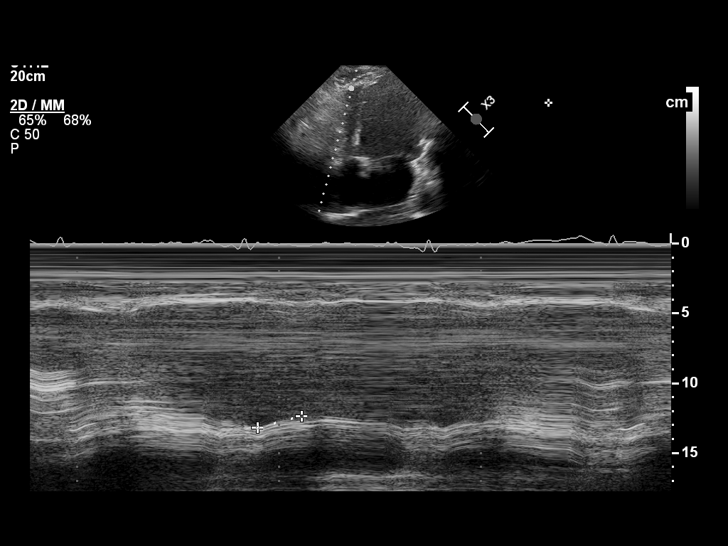
[im 39/82]
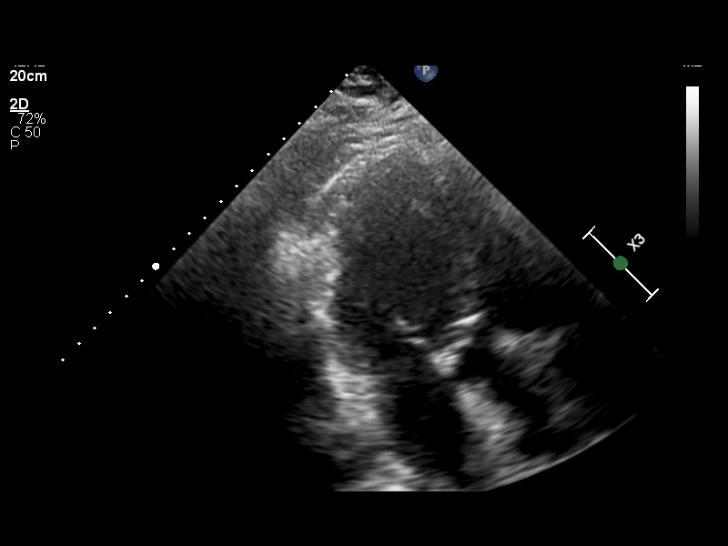
[im 43/82]
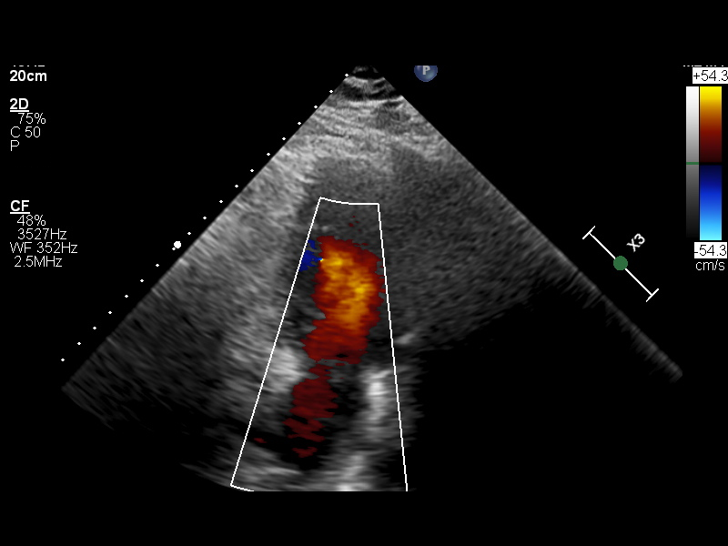
[im 50/82]
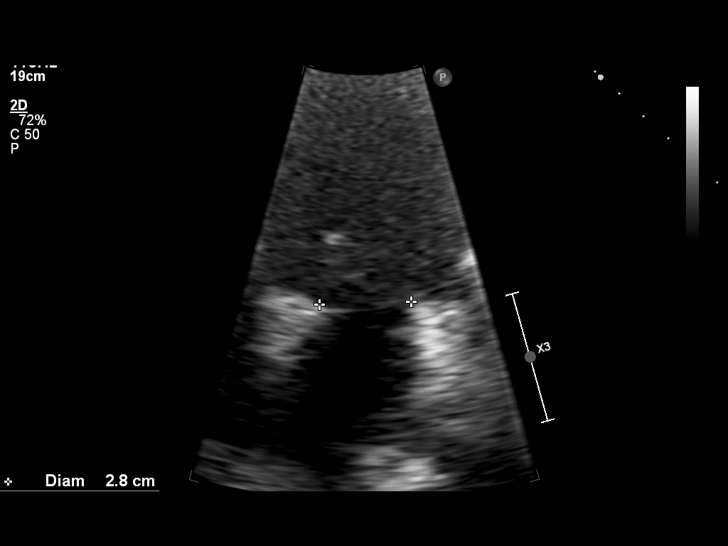
[im 57/82]
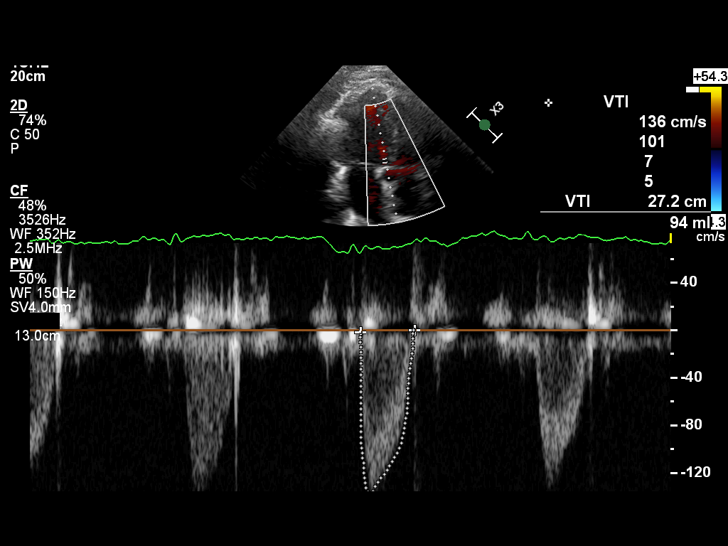
[im 64/82]
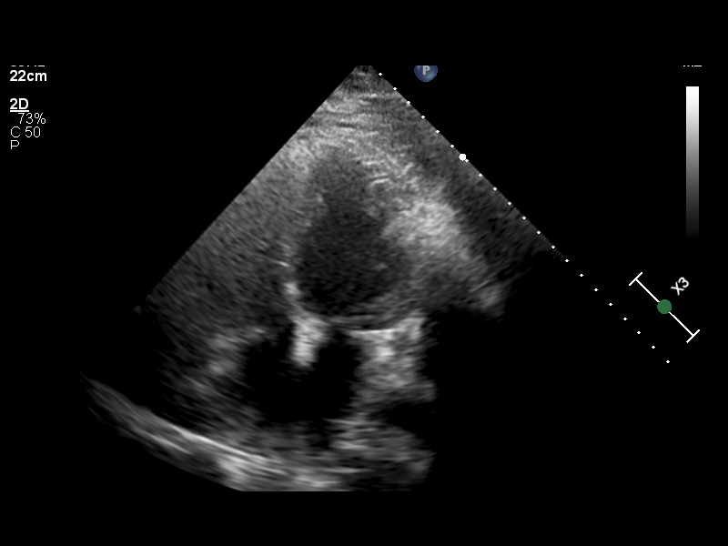
[im 67/82]
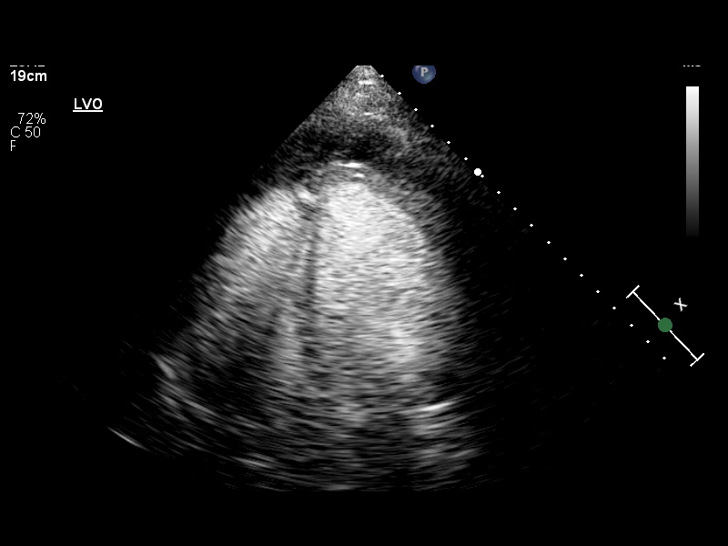
[im 74/82]
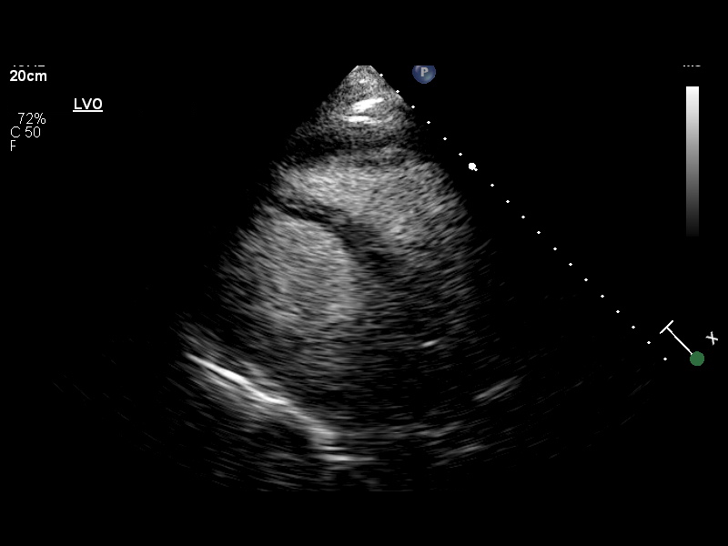
[im 82/82]
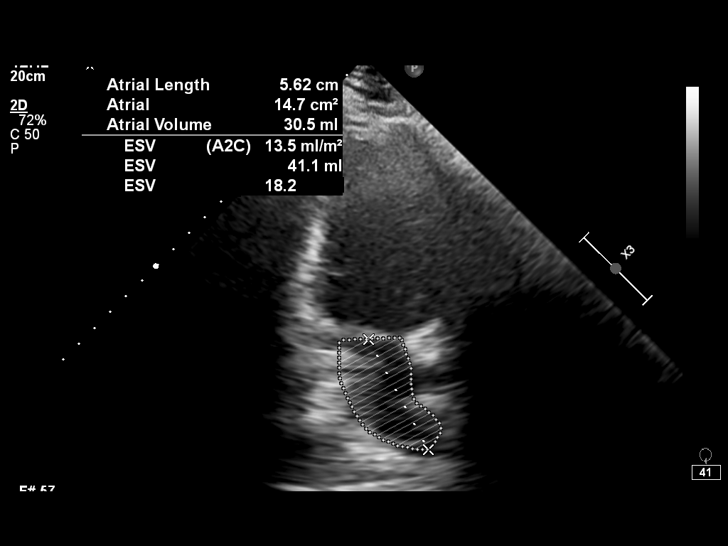

[14 of 24 positions shown; findings below may reference images not displayed]

------------- REPORT [id number] -------------
FINAL REPORT IS SCANNED IN THE PATIENT'S EMR.

Tech Notes:

[employer] Lot#[id number]. NKDA. JL

------------- REPORT [id number] -------------
FINAL REPORT IS SCANNED IN THE PATIENT'S EMR.

Tech Notes:

[employer] Lot#[id number]. NKDA. JL

## 2019-01-23 IMAGING — NM NM stress 1[PERSON_NAME]<250 M<275
5 series · 20 of 20 positions shown · non-contrast
Comparison: none

[(person_name) · 4.52mm/px · 1 of 1 slices shown (1 of 2)]
[im 1/1]
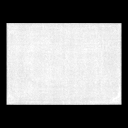

[(person_name) · 4.52mm/px · 1 of 1 slices shown (2 of 2)]
[im 1/1]
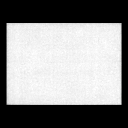

[rest · 6.78mm/px · 6 of 72 frames shown]
[frame 7/72]
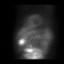
[frame 19/72]
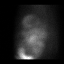
[frame 31/72]
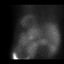
[frame 43/72]
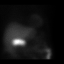
[frame 55/72]
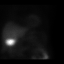
[frame 67/72]
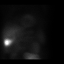

[sgate · 6.78mm/px · 6 of 576 frames shown]
[frame 49/576]
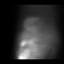
[frame 145/576]
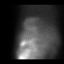
[frame 241/576]
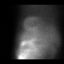
[frame 337/576]
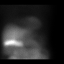
[frame 433/576]
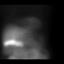
[frame 529/576]
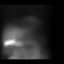

[cardiac spect · 6.8mm · 6.78mm/px · 6 of 21 frames shown]
[frame 2/21]
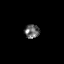
[frame 6/21]
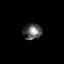
[frame 9/21]
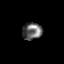
[frame 13/21]
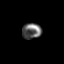
[frame 16/21]
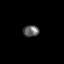
[frame 20/21]
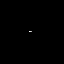

[20 of 20 positions shown; findings below may reference images not displayed]

FINAL REPORT IS SCANNED IN THE PATIENT'S EMR.

Tech Notes:

## 2020-11-25 ENCOUNTER — Encounter: Admit: 2020-11-25 | Discharge: 2020-11-25 | Payer: MEDICARE | Primary: Family

## 2020-12-01 ENCOUNTER — Encounter: Admit: 2020-12-01 | Discharge: 2020-12-01 | Payer: MEDICARE | Primary: Family

## 2020-12-01 NOTE — Telephone Encounter
12/01/20 - Records requested per [place] [facility] note below lkg    --Please request additional records from PCP, Darden Dates, NP - Ph: [phone]; Fax: [phone] and cardiologist, Dr. Scotty Court - Ph: [phone].--

## 2020-12-02 NOTE — Telephone Encounter
12/01/20 Records recevied from Darden Dates, NP Records are in pt chart through onbase edh

## 2020-12-04 ENCOUNTER — Encounter: Admit: 2020-12-04 | Discharge: 2020-12-04 | Payer: MEDICARE | Primary: Family

## 2020-12-04 NOTE — Progress Notes
Records Request    Medical records request for continuation of care:    Patient has appointment  with  Dr. Arna Medici .    Please fax records to Cardiovascular Medicine [place] [facility] [phone]    Request records:    Allen Lloyd  04-13-1955    CABG report from 2005    Thank you,      Cardiovascular Medicine  [facility]  [address]  [place], [place] [address]  Phone:  [phone]  Fax:  [phone]

## 2020-12-04 NOTE — Progress Notes
Records Request-STAT    Medical records request for continuation of care:    Patient has appointment    with Dr. Arna Medici .    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  [place], [place] [address]  Phone:  [phone]  Fax:  [phone]

## 2020-12-08 ENCOUNTER — Encounter: Admit: 2020-12-08 | Discharge: 2020-12-08 | Payer: MEDICARE | Primary: Family

## 2020-12-08 DIAGNOSIS — I739 Peripheral vascular disease, unspecified: Secondary | ICD-10-CM

## 2020-12-08 DIAGNOSIS — Z86718 Personal history of other venous thrombosis and embolism: Secondary | ICD-10-CM

## 2020-12-09 ENCOUNTER — Encounter: Admit: 2020-12-09 | Discharge: 2020-12-09 | Payer: MEDICARE | Primary: Family

## 2020-12-09 ENCOUNTER — Inpatient Hospital Stay: Admit: 2020-12-09 | Discharge: 2020-12-09 | Payer: MEDICARE | Primary: Family

## 2020-12-09 DIAGNOSIS — Z86718 Personal history of other venous thrombosis and embolism: Secondary | ICD-10-CM

## 2020-12-09 DIAGNOSIS — E785 Hyperlipidemia, unspecified: Secondary | ICD-10-CM

## 2020-12-09 DIAGNOSIS — Z136 Encounter for screening for cardiovascular disorders: Secondary | ICD-10-CM

## 2020-12-09 DIAGNOSIS — R079 Chest pain, unspecified: Secondary | ICD-10-CM

## 2020-12-09 DIAGNOSIS — I739 Peripheral vascular disease, unspecified: Secondary | ICD-10-CM

## 2020-12-09 DIAGNOSIS — I2510 Atherosclerotic heart disease of native coronary artery without angina pectoris: Secondary | ICD-10-CM

## 2020-12-09 DIAGNOSIS — I25119 Atherosclerotic heart disease of native coronary artery with unspecified angina pectoris: Secondary | ICD-10-CM

## 2020-12-09 DIAGNOSIS — I200 Unstable angina: Secondary | ICD-10-CM

## 2020-12-09 DIAGNOSIS — I208 Other forms of angina pectoris: Secondary | ICD-10-CM

## 2020-12-09 LAB — CBC AND DIFF
ABSOLUTE BASO COUNT: 0 K/UL (ref 0–0.20)
WBC COUNT: 6 K/UL (ref 4.5–11.0)

## 2020-12-09 LAB — COMPREHENSIVE METABOLIC PANEL
ALBUMIN: 4.6 g/dL (ref 3.5–5.0)
ALK PHOSPHATASE: 105 U/L (ref 25–110)
ALT: 19 U/L (ref 7–56)
ANION GAP: 12 K/UL (ref 3–12)
AST: 17 U/L (ref 7–40)
CALCIUM: 9.9 mg/dL (ref 8.5–10.6)
CO2: 25 MMOL/L (ref 21–30)
CREATININE: 1.2 mg/dL — ABNORMAL HIGH (ref 0.4–1.24)
EGFR: >60 mL/min (ref >60)
SODIUM: 140 MMOL/L (ref 137–147)
TOTAL BILIRUBIN: 0.7 mg/dL (ref 0.3–1.2)
TOTAL PROTEIN: 7.8 g/dL (ref 6.0–8.0)

## 2020-12-09 LAB — TROPONIN-I: TROPONIN I: 0 ng/mL — ABNORMAL HIGH (ref 0.0–0.05)

## 2020-12-09 LAB — BNP (B-TYPE NATRIURETIC PEPTI): BNP: 47 pg/mL (ref 0–100)

## 2020-12-09 LAB — PROTIME INR (PT): PROTIME: 13 s (ref 9.5–14.2)

## 2020-12-09 LAB — TSH WITH FREE T4 REFLEX: TSH: 1.1 uU/mL — ABNORMAL HIGH (ref 0.35–5.00)

## 2020-12-09 LAB — MAGNESIUM: MAGNESIUM: 2.2 mg/dL (ref 1.6–2.6)

## 2020-12-09 LAB — PTT (APTT): PTT: 35 s (ref 24.0–36.5)

## 2020-12-09 MED ORDER — LISINOPRIL 2.5 MG PO TAB
2.5 mg | Freq: Every day | ORAL | 0 refills | Status: AC
Start: 2020-12-09 — End: ?
  Administered 2020-12-10 – 2020-12-11 (×3): 2.5 mg via ORAL

## 2020-12-09 MED ORDER — NITROGLYCERIN 0.4 MG SL SUBL
.4 mg | SUBLINGUAL | 0 refills | Status: AC | PRN
Start: 2020-12-09 — End: ?

## 2020-12-09 MED ORDER — ASPIRIN 81 MG PO CHEW
81 mg | Freq: Every day | ORAL | 0 refills | Status: AC
Start: 2020-12-09 — End: ?

## 2020-12-09 MED ORDER — HEPARIN (PORCINE) BOLUS FOR CONTINUOUS INF (VIAL)
20-40 [IU]/kg | INTRAVENOUS | 0 refills | Status: AC
Start: 2020-12-09 — End: ?

## 2020-12-09 MED ORDER — HYDROCODONE-ACETAMINOPHEN 5-325 MG PO TAB
1 | ORAL | 0 refills | Status: AC | PRN
Start: 2020-12-09 — End: ?

## 2020-12-09 MED ORDER — SODIUM CHLORIDE 0.9 % IV SOLP
1000 mL | INTRAVENOUS | 0 refills | Status: AC
Start: 2020-12-09 — End: ?
  Administered 2020-12-10: 03:00:00 1000 mL via INTRAVENOUS

## 2020-12-09 MED ORDER — HEPARIN (PORCINE) 1,000 UNIT/ML IJ SOLN
7500 [IU] | Freq: Once | INTRAVENOUS | 0 refills | Status: CP
Start: 2020-12-09 — End: ?
  Administered 2020-12-10: 02:00:00 7500 [IU] via INTRAVENOUS

## 2020-12-09 MED ORDER — CARVEDILOL 12.5 MG PO TAB
12.5 mg | Freq: Two times a day (BID) | ORAL | 0 refills | Status: AC
Start: 2020-12-09 — End: ?
  Administered 2020-12-10 – 2020-12-11 (×2): 12.5 mg via ORAL

## 2020-12-09 MED ORDER — RIVAROXABAN 20 MG PO TAB
20 mg | Freq: Every day | ORAL | 0 refills | Status: AC
Start: 2020-12-09 — End: ?

## 2020-12-09 MED ORDER — ASPIRIN 325 MG PO TAB
325 mg | Freq: Once | ORAL | 0 refills | Status: AC
Start: 2020-12-09 — End: ?
  Administered 2020-12-10: 11:00:00 325 mg via ORAL

## 2020-12-09 MED ORDER — CLOPIDOGREL 75 MG PO TAB
75 mg | Freq: Every day | ORAL | 0 refills | Status: AC
Start: 2020-12-09 — End: ?

## 2020-12-09 MED ORDER — ATORVASTATIN 40 MG PO TAB
80 mg | Freq: Every day | ORAL | 0 refills | Status: AC
Start: 2020-12-09 — End: ?
  Administered 2020-12-10 – 2020-12-11 (×3): 80 mg via ORAL

## 2020-12-09 MED ORDER — INSULIN GLARGINE 100 UNIT/ML (3 ML) SC INJ PEN
25 [IU] | Freq: Every evening | SUBCUTANEOUS | 0 refills | Status: AC
Start: 2020-12-09 — End: ?
  Administered 2020-12-10: 03:00:00 25 [IU] via SUBCUTANEOUS

## 2020-12-09 MED ORDER — GABAPENTIN 300 MG PO CAP
300 mg | Freq: Three times a day (TID) | ORAL | 0 refills | Status: AC
Start: 2020-12-09 — End: ?
  Administered 2020-12-10 – 2020-12-11 (×6): 300 mg via ORAL

## 2020-12-09 MED ORDER — HEPARIN (PORCINE) IN 5 % DEX 20,000 UNIT/500 ML (40 UNIT/ML) IV SOLP
0-2000 [IU]/h | INTRAVENOUS | 0 refills | Status: AC
Start: 2020-12-09 — End: ?
  Administered 2020-12-10: 17:00:00 1420 [IU]/h via INTRAVENOUS
  Administered 2020-12-10: 02:00:00 1620 [IU]/h via INTRAVENOUS

## 2020-12-09 NOTE — Progress Notes
Records Request- STAT    Medical records request for continuation of care:    Patient has appointment NOW   with  Dr. Arna Medici .    Please fax records to Cardiovascular Medicine [place] [facility] [phone]    Request records:    Trajon Rosete  [birth year]    Please have most recent ECHO/CATH films clouded ASAP    Thank you,      Cardiovascular Medicine  [facility]  [address]  [place], [place] [address]  Phone:  [phone]  Fax:  [phone]

## 2020-12-09 NOTE — H&P (View-Only)
General Medicine Service  Admission History and Physical Examination      Name:  Allen Lloyd                                             MRN:  [id number]   Admission Date:  12/09/2020                     Assessment/Plan:   Principal Problem:    Angina at rest [facility] [place])  Active Problems:    Unstable angina (HCC)      Allen Lloyd is a 65 y.o. 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 He presents with increasing exertional chest discomfort.        #Unstable Angina  #CAD s/p CABG  - Extensive CAD history  - last cath at [facility] June 2022, LIMA graft was only graft open  - EKG 12/09/20: Sinus rhythm with 1st degree AV block with occasional premature ventricular complexes. Inferior infarct, age undetermined. When compared with ECG of 09-Dec-2020 15:21, PVS and inferior infarct now present  - CXR: 12/09/20 not yet formally read, by my professional assessment it appears there is borderline cardiomegaly, perihilar fullness; no evidence of pulmonary infiltrates, consolidation nor effusion  - Trop trend: pending  - BNP 47, does not appear overloaded  Plan:  > theraputic heparin GTT  > LHC 9/28 - ordered  > Trend troponin q4h  > Repeat EKG PRN  > continue PTA Atorvastatin 80mg  QD  > s/p ASA 325 on admit, continue 81mg  QD  > Supplemental O2 PRN for SpO2>90% or PaO2>59  > Holding plavix for cath  > lipid panel in AM    #T2DM  - PTA lantus 30 QHS  Plan:  > decrease PTA lantus to 25 while inpatient/NPO  > PTA oral meds held, restart before DC    #DVT  -Currently on xarelto  Plan:  > Hold PTA xarelto  > Heparin drip, can pause peri-procedure    #Hx alcohol use disorder, abstinent 3 years  - reports years of heavy drinking requiring withdrawal 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 Gladys Damme MD   Internal Medicine PGY2  Pager [phone]      __________________________________________________________________________________      Chief Complaint:  'Chest pain'    History of Present Illness:     Allen Lloyd is a 65 y.o. 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 He presents with increasing exertional chest discomfort.     Allen Lloyd reports 2 different types of chest discomfort.  One is chronic and present all the time.  The chronic discomfort he feels near his rib margins and increases  with a deep breath.  This is not angina.  However, he also reports exertional discomfort with walking or with activity such as mowing the lawn.  He refers that this is his lawnmowing discomfort.  This is a retrosternal pressure-like discomfort associated with dyspnea which radiates towards his neck, shoulders and jaws.  This discomfort tends to last for 15 to 20 minutes resolving with rest.        Past Medical History     Medical History:   Diagnosis Date   ? Coronary artery disease    ? History of DVT (deep vein thrombosis) 12/08/2020   ? PVD (peripheral vascular disease) (HCC) 12/08/2020       Past Surgical History   No past surgical history on file.    Family History     Family History   Problem Relation Age of Onset   ? Unknown to Patient Mother    ? Unknown to Patient Father        Social History     Social History     Socioeconomic History   ? Marital status: Divorced   Tobacco Use   ? Smoking status: Former Smoker     Types: Cigarettes     Quit date: 03/15/1968     Years since quitting: 52.7   ? Smokeless tobacco: Never Used   Substance and Sexual Activity ? Alcohol use: Not Currently   ? Drug use: Not Currently        Immunizations (includes history and patient reported):   Immunization History   Administered Date(s) Administered   ? COVID-19 (MODERNA), mRNA vacc, 100 mcg/0.5 mL (PF) 06/18/2019, 07/16/2019, 01/25/2020           Allergies:  Janumet [sitagliptin-metformin]    Medications Prior to Admission:    Current Outpatient Medications   Medication Instructions   ? atorvastatin (LIPITOR) 80 mg, Oral, DAILY   ? carvediloL (COREG) 12.5 mg, Oral, TWICE DAILY   ? clopiDOGrel (PLAVIX) 75 mg, Oral, DAILY   ? furosemide (LASIX) 40 mg, Oral, AS NEEDED   ? gabapentin (NEURONTIN) 300 mg, Oral, THREE TIMES DAILY   ? HYDROcodone/acetaminophen (NORCO) 5/325 mg tablet 1 tablet, Oral, EVERY  6 HOURS PRN   ? insulin detemir U-100(+) (LEVEMIR) 30 Units, Subcutaneous, DAILY   ? lisinopriL (ZESTRIL) 2.5 mg, Oral, DAILY   ? metFORMIN (GLUCOPHAGE) 1,000 mg, Oral, TWICE DAILY   ? nitroglycerin (NITROSTAT) 0.4 mg, Sublingual, EVERY  5 MIN PRN, Max of 3 tablets, call 911.   ? potassium chloride (KLOR-CON SPRINKLE) 10 mEq capsule 10 mEq, Oral, DAILY, Take with a meal and a full glass of water.   ? TRADJENTA 5 mg, Oral, DAILY   ? XARELTO 20 mg tablet 1 tablet, Oral, DAILY       Review of Systems     14-point ROS performed and negative except for:  Review of Systems   Constitutional: Negative for chills, fever and malaise/fatigue.   HENT: Negative.    Eyes: Negative.    Respiratory: Positive for cough and shortness of breath. Negative for sputum production and wheezing.    Cardiovascular: Positive for chest pain, palpitations, claudication and leg swelling. Negative for orthopnea.   Gastrointestinal: Positive for abdominal pain and heartburn. Negative for nausea and vomiting.   Genitourinary: Negative.    Musculoskeletal: Negative.    Neurological: Negative for dizziness, focal weakness, loss of consciousness and headaches.   Psychiatric/Behavioral: Negative.          Physical Exam Vital signs:  BP: 153/85 (09/27 2227)  Temp: 36.9 ?C (98.4 ?F) (09/27 2227)  Pulse: 70 (09/27 2241)  Respirations: 20 PER MINUTE (09/27 2241)  SpO2: 97 % (09/27 2241)  O2 Device: None (Room air) (09/27 2241)  Height: 182.9 cm (6') (09/27 1920)  BP Readings from Last 6 Encounters:   12/09/20 (!) 153/85   12/09/20 (!) 142/88     Wt Readings from Last 3 Encounters:   12/09/20 108 kg (238 lb)   12/09/20 108 kg (238 lb)       General: 64 y.o. male, obese, cooperative, no distress, appears stated age  Head: normocephalic, atraumatic  Eyes: R Conjunctivae/cornea clear, EOMs intact. L eye nonfunctional.  ENT: No gross deformities. Mucous membranes moist. Pharynx non-erythematous  Neck: Supple, symmetrical, trachea midline, no adenopathy, thyroid not enlarged, no carotid bruit, no JVD  Back: Symmetric, ROM normal.  No CVA tenderness.  Lungs: Clear to auscultation bilaterally; no wheezes, rales, or rhonchi  Heart: Regular rate and rhythm; no murmurs, rubs, or gallops  Abdomen: distended, tense, shifting dullness, mildly TTP LUQ . Bowel sounds hypoactive, splenomegaly. No masses. No hepatomegaly.  Extremities: Pulses present, BLLE edema with L>R.  Venous stasis changes BLLE.  LLE with 3+ pitting edema, TTP, warmth, redness with venous stasis ulcers in the process of healing.  No drainage.  Neurologic: Alert, oriented x 3. Strength and sensation grossly conserved      Lab/Radiology/Other Diagnostic Tests:  24-hour labs:    Results for orders placed or performed during the hospital encounter of 12/09/20 (from the past 24 hour(s))   CBC AND DIFF    Collection Time: 12/09/20  8:00 PM   Result Value Ref Range    White Blood Cells 6.0 4.5 - 11.0 K/UL    RBC 5.48 4.4 - 5.5 M/UL    Hemoglobin 16.0 13.5 - 16.5 GM/DL    Hematocrit 16.1 40 - 50 %    MCV 85.3 80 - 100 FL    MCH 29.2 26 - 34 PG    MCHC 34.2 32.0 - 36.0 G/DL    RDW 09.6 (H) 11 - 15 %    Platelet Count 169 150 - 400 K/UL    MPV 8.1 7 - 11 FL    Neutrophils 61 41 - 77 % Lymphocytes 29 24 - 44 %    Monocytes 8 4 - 12 %    Eosinophils 1 0 - 5 %    Basophils 1 0 - 2 %    Absolute Neutrophil Count 3.70 1.8 - 7.0 K/UL    Absolute Lymph Count 1.71 1.0 - 4.8 K/UL    Absolute Monocyte Count 0.45 0 - 0.80 K/UL    Absolute Eosinophil Count 0.07 0 - 0.45 K/UL    Absolute Basophil Count 0.03 0 - 0.20 K/UL   PTT (APTT)    Collection Time: 12/09/20  8:00 PM   Result Value Ref Range    APTT 35.1 24.0 - 36.5 SEC   PROTIME INR (PT)    Collection Time: 12/09/20  8:00 PM   Result Value Ref Range    Protime 13.7 9.5 - 14.2 SEC    INR 1.2 0.8 - 1.2   COMPREHENSIVE METABOLIC PANEL    Collection Time: 12/09/20  8:00 PM   Result Value Ref Range    Sodium 140 137 - 147 MMOL/L    Potassium 3.8 3.5 - 5.1 MMOL/L    Chloride 103 98 - 110 MMOL/L    Glucose 133 (H) 70 - 100  MG/DL    Blood Urea Nitrogen 14 7 - 25 MG/DL    Creatinine 6.28 (H) 0.4 - 1.24 MG/DL    Calcium 9.9 8.5 - 31.5 MG/DL    Total Protein 7.8 6.0 - 8.0 G/DL    Total Bilirubin 0.7 0.3 - 1.2 MG/DL    Albumin 4.6 3.5 - 5.0 G/DL    Alk Phosphatase 176 25 - 110 U/L    AST (SGOT) 17 7 - 40 U/L    CO2 25 21 - 30 MMOL/L    ALT (SGPT) 19 7 - 56 U/L    Anion Gap 12 3 - 12    eGFR >60 >60 mL/min   MAGNESIUM    Collection Time: 12/09/20  8:00 PM   Result Value Ref Range    Magnesium 2.2 1.6 - 2.6 mg/dL   BNP (B-TYPE NATRIURETIC PEPTI)    Collection Time: 12/09/20  8:00 PM   Result Value Ref Range    B Type Natriuretic Peptide 47.0 0 - 100 PG/ML   TSH WITH FREE T4 REFLEX    Collection Time: 12/09/20  8:00 PM   Result Value Ref Range    TSH 1.12 0.35 - 5.00 MCU/ML   TROPONIN-I    Collection Time: 12/09/20  8:00 PM   Result Value Ref Range    Troponin-I 0.01 0.0 - 0.05 NG/ML     Glucose: (!) 133 (12/09/20 2000)  Pertinent radiology personally viewed.

## 2020-12-10 ENCOUNTER — Inpatient Hospital Stay: Admit: 2020-12-10 | Discharge: 2020-12-09 | Payer: MEDICARE | Primary: Family

## 2020-12-10 ENCOUNTER — Encounter: Admit: 2020-12-10 | Discharge: 2020-12-10 | Payer: MEDICARE | Primary: Family

## 2020-12-10 ENCOUNTER — Inpatient Hospital Stay: Admit: 2020-12-10 | Discharge: 2020-12-10 | Payer: MEDICARE | Primary: Family

## 2020-12-10 ENCOUNTER — Inpatient Hospital Stay: Admit: 2020-12-10 | Payer: MEDICARE

## 2020-12-10 DIAGNOSIS — Z86718 Personal history of other venous thrombosis and embolism: Secondary | ICD-10-CM

## 2020-12-10 DIAGNOSIS — I2510 Atherosclerotic heart disease of native coronary artery without angina pectoris: Secondary | ICD-10-CM

## 2020-12-10 DIAGNOSIS — I739 Peripheral vascular disease, unspecified: Secondary | ICD-10-CM

## 2020-12-10 MED ADMIN — PERFLUTREN LIPID MICROSPHERES 1.1 MG/ML IV SUSP [79178]: 3 mL | INTRAVENOUS | @ 14:00:00 | Stop: 2020-12-10 | NDC 11994001116

## 2020-12-10 MED ADMIN — EZETIMIBE 10 MG PO TAB [86887]: 10 mg | ORAL | @ 18:00:00 | NDC 67877049030

## 2020-12-10 MED ADMIN — POTASSIUM CHLORIDE 20 MEQ PO TBTQ [35943]: 40 meq | ORAL | @ 13:00:00 | Stop: 2020-12-10 | NDC 00245531989

## 2020-12-10 NOTE — Progress Notes
RT Adult Assessment Note    NAME:Nikki Brenning             MRN: [id number]             DOB:Mar 15, 1956          AGE: 65 y.o.  ADMISSION DATE: 12/09/2020             DAYS ADMITTED: LOS: 0 days    RT Treatment Plan:       Protocol Plan: Procedures  PEP Therapy: Place a nursing order for "IS Q1h While Awake" for any of Lung Expansion indicators  SpO2: Continuous (Document SpO2 result Qshift)  Comment: Refused CPOX    Additional Comments:  Impressions of the patient: NAD  Intervention(s)/outcome(s): See above  Patient education that was completed: NA  Recommendations to the care team: NA    Vital Signs:  Pulse: 70  RR: 20 PER MINUTE  SpO2: 97 %  O2 Device: None (Room air)  Liter Flow:    O2%:    Breath Sounds:    Respiratory Effort: Non-Labored

## 2020-12-11 ENCOUNTER — Encounter: Admit: 2020-12-11 | Discharge: 2020-12-11 | Payer: MEDICARE | Primary: Family

## 2020-12-11 MED ADMIN — EZETIMIBE 10 MG PO TAB [86887]: 10 mg | ORAL | @ 15:00:00 | Stop: 2020-12-11 | NDC 67877049030

## 2020-12-11 MED ADMIN — DAPAGLIFLOZIN 10 MG PO TAB [320172]: 10 mg | ORAL | @ 16:00:00 | Stop: 2020-12-11 | NDC 00310621030

## 2020-12-11 MED ADMIN — SPIRONOLACTONE 25 MG PO TAB [7437]: 12.5 mg | ORAL | @ 16:00:00 | Stop: 2020-12-11 | NDC 00904692761

## 2020-12-11 MED ADMIN — CLOPIDOGREL 75 MG PO TAB [78966]: 75 mg | ORAL | @ 15:00:00 | Stop: 2020-12-11 | NDC 00904629461

## 2020-12-11 MED ADMIN — POTASSIUM CHLORIDE 20 MEQ PO TBTQ [35943]: 40 meq | ORAL | @ 15:00:00 | Stop: 2020-12-11 | NDC 00245531989

## 2020-12-11 MED FILL — EZETIMIBE 10 MG PO TAB: 10 mg | ORAL | 30 days supply | Qty: 30 | Fill #1 | Status: CP

## 2020-12-11 MED FILL — DAPAGLIFLOZIN 10 MG PO TAB: 10 mg | ORAL | 90 days supply | Qty: 90 | Fill #1 | Status: CP

## 2020-12-11 MED FILL — SPIRONOLACTONE 25 MG PO TAB: 25 mg | ORAL | 30 days supply | Qty: 15 | Fill #1 | Status: CP

## 2020-12-11 MED FILL — CARVEDILOL 12.5 MG PO TAB: 12.5 mg | ORAL | 30 days supply | Qty: 60 | Fill #1 | Status: CP

## 2020-12-12 ENCOUNTER — Encounter: Admit: 2020-12-12 | Discharge: 2020-12-12 | Payer: MEDICARE | Primary: Family

## 2020-12-12 ENCOUNTER — Ambulatory Visit: Admit: 2020-12-12 | Discharge: 2020-12-12 | Payer: MEDICARE | Primary: Family

## 2020-12-12 DIAGNOSIS — Z9189 Other specified personal risk factors, not elsewhere classified: Secondary | ICD-10-CM

## 2020-12-23 ENCOUNTER — Encounter: Admit: 2020-12-23 | Discharge: 2020-12-23 | Payer: MEDICARE | Primary: Family

## 2020-12-23 NOTE — Telephone Encounter
-----   Message from Willene Hatchet, RN sent at 12/23/2020  1:44 PM CDT -----  Regarding: FW: SDO-pt  sent back life vest  FYI    ----- Message -----  From: Arvilla Market  Sent: 12/23/2020   1:36 PM CDT  To: [facility] Nurse Gen Card Team [place]  Subject: SDO-pt  sent back life vest  FYI                 Hello,     Called and spoke with patient and he stated that he has been having problems with the life vest and he decided to send the monitor back due to getting in trouble with the life vest in a court room. He stated that they searched him in the courtroom thinking he was wearing a bomb.  He states that he is way to active to wear this life vest and he has decided to sent it back and not wear it.     Thank you  Toma Copier

## 2020-12-23 NOTE — Telephone Encounter
Called and spoke with patient and he stated that he has been having problems with the life vest and he decided to send the monitor back due to getting in trouble with the life vest in a court room. He states that he is way to active to wear this life vest and he has decided to sent it back and not wear it. BC

## 2020-12-23 NOTE — Telephone Encounter
Called and left message requesting callback to check symptoms and discuss lifevest further.

## 2021-01-01 ENCOUNTER — Encounter: Admit: 2021-01-01 | Discharge: 2021-01-01 | Payer: MEDICARE | Primary: Family

## 2021-01-01 DIAGNOSIS — I739 Peripheral vascular disease, unspecified: Secondary | ICD-10-CM

## 2021-01-01 DIAGNOSIS — I10 Essential (primary) hypertension: Secondary | ICD-10-CM

## 2021-01-01 DIAGNOSIS — E785 Hyperlipidemia, unspecified: Secondary | ICD-10-CM

## 2021-01-01 DIAGNOSIS — Z136 Encounter for screening for cardiovascular disorders: Secondary | ICD-10-CM

## 2021-01-01 DIAGNOSIS — I2510 Atherosclerotic heart disease of native coronary artery without angina pectoris: Secondary | ICD-10-CM

## 2021-01-01 DIAGNOSIS — Z86718 Personal history of other venous thrombosis and embolism: Secondary | ICD-10-CM

## 2021-01-01 MED ORDER — SPIRONOLACTONE 25 MG PO TAB
25 mg | ORAL_TABLET | Freq: Every day | ORAL | 3 refills | 90.00000 days | Status: AC
Start: 2021-01-01 — End: ?

## 2021-01-01 MED ORDER — FUROSEMIDE 40 MG PO TAB
40 mg | ORAL_TABLET | ORAL | 3 refills | 90.00000 days | Status: AC | PRN
Start: 2021-01-01 — End: ?

## 2021-01-01 MED ORDER — CARVEDILOL 12.5 MG PO TAB
12.5 mg | ORAL_TABLET | Freq: Two times a day (BID) | ORAL | 3 refills | 90.00000 days | Status: AC
Start: 2021-01-01 — End: ?

## 2021-01-01 MED ORDER — ATORVASTATIN 80 MG PO TAB
80 mg | ORAL_TABLET | Freq: Every day | ORAL | 3 refills | Status: AC
Start: 2021-01-01 — End: ?

## 2021-01-01 MED ORDER — CLOPIDOGREL 75 MG PO TAB
75 mg | ORAL_TABLET | Freq: Every day | ORAL | 3 refills | 90.00000 days | Status: AC
Start: 2021-01-01 — End: ?

## 2021-01-01 MED ORDER — LISINOPRIL 2.5 MG PO TAB
2.5 mg | ORAL_TABLET | Freq: Every day | ORAL | 3 refills | Status: AC
Start: 2021-01-01 — End: ?

## 2021-01-01 MED ORDER — XARELTO 20 MG PO TAB
20 mg | ORAL_TABLET | Freq: Every day | ORAL | 3 refills | 30.00000 days | Status: AC
Start: 2021-01-01 — End: ?

## 2021-01-01 MED ORDER — EZETIMIBE 10 MG PO TAB
10 mg | ORAL_TABLET | Freq: Every day | ORAL | 3 refills | Status: AC
Start: 2021-01-01 — End: ?
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
## 2021-01-01 NOTE — Patient Instructions
Continue on spiroloactone every other day till we get lab results if potassium is ok we will change to 25 mg daily.  Follow up as directed.  Call sooner if issues.  Call the [facility] nursing line at [phone].  Leave a detailed message for the nurse in [place] with how we can assist you and we will call you back.

## 2021-01-06 ENCOUNTER — Encounter: Admit: 2021-01-06 | Discharge: 2021-01-06 | Payer: MEDICARE | Primary: Family

## 2021-01-06 DIAGNOSIS — E785 Hyperlipidemia, unspecified: Secondary | ICD-10-CM

## 2021-01-06 DIAGNOSIS — I10 Essential (primary) hypertension: Secondary | ICD-10-CM

## 2021-01-06 DIAGNOSIS — I429 Cardiomyopathy, unspecified: Secondary | ICD-10-CM

## 2021-01-06 DIAGNOSIS — I739 Peripheral vascular disease, unspecified: Secondary | ICD-10-CM

## 2021-01-06 DIAGNOSIS — Z136 Encounter for screening for cardiovascular disorders: Secondary | ICD-10-CM

## 2021-01-06 LAB — ALT (SGPT): ALT: 20

## 2021-01-06 LAB — BASIC METABOLIC PANEL
BLD UREA NITROGEN: 14
CALCIUM: 9.5
CHLORIDE: 105
CO2: 28
CREATININE: 1.1
GLUCOSE,PANEL: 115 — ABNORMAL HIGH (ref 70–105)
POTASSIUM: 4.2
SODIUM: 139

## 2021-01-06 LAB — LIPID PROFILE
CHOLESTEROL/HDL %: 4
HDL: 34 — ABNORMAL LOW (ref >=40)
TRIGLYCERIDES: 126
VLDL: 25

## 2021-01-06 NOTE — Telephone Encounter
-----   Message from Hester Mates, MD sent at 01/06/2021  3:13 PM CDT -----  Adelina Mings: Okay to increase spironolactone to 25 mg daily.  Please recheck his fasting lipid profile and ALT in 3 months.  Prepare him for the fact that he may need Repatha if his LDL cholesterol does not fall below 70 mg/dL.  Thanks.  SBG  ----- Message -----  From: Florene Route, RN  Sent: 01/06/2021   2:11 PM CDT  To: Hester Mates, MD    Labs for your review and recommendations. Ordered at 01/01/21 OV. Patient recently had LHC with stent placement.  Patient currently on Lipitor 80mg  daily and Zetia 10mg  daily. Okay to increase Spironolactone to 25mg  daily? Thanks Dr. !

## 2021-01-06 NOTE — Telephone Encounter
-----   Message from Hester Mates, MD sent at 01/06/2021  3:16 PM CDT -----  Regarding: RE: ICD  Brett Canales: Either is good.  Thanks.  SBG  ----- Message -----  From: Weston Brass  Sent: 01/06/2021  11:12 AM CDT  To: Hester Mates, MD  Subject: ICD                                               He now wants be considered for an internal cardioverter defibrillator.  Would you like me to get him in with [facility] or RAD?      Thanks  Brett Canales

## 2021-01-07 ENCOUNTER — Encounter: Admit: 2021-01-07 | Discharge: 2021-01-07 | Payer: MEDICARE | Primary: Family

## 2021-01-07 NOTE — Telephone Encounter
-----   Message from Mads B Gollub, MD sent at 01/06/2021  3:16 PM CDT -----  Regarding: RE: ICD  Steve: Either is good.  Thanks.  SBG  ----- Message -----  From: Omnia Dollinger  Sent: 01/06/2021  11:12 AM CDT  To: Dvante B Gollub, MD  Subject: ICD                                               He now wants be considered for an internal cardioverter defibrillator.  Would you like me to get him in with [facility] or [facility]?      Thanks  Steve

## 2021-01-12 ENCOUNTER — Encounter: Admit: 2021-01-12 | Discharge: 2021-01-12 | Payer: MEDICARE | Primary: Family

## 2021-01-12 DIAGNOSIS — I200 Unstable angina: Secondary | ICD-10-CM

## 2021-01-12 DIAGNOSIS — Z86718 Personal history of other venous thrombosis and embolism: Secondary | ICD-10-CM

## 2021-01-12 DIAGNOSIS — I429 Cardiomyopathy, unspecified: Secondary | ICD-10-CM

## 2021-01-12 DIAGNOSIS — R609 Edema, unspecified: Secondary | ICD-10-CM

## 2021-01-12 NOTE — Telephone Encounter
-----   Message from Hester Mates, MD sent at 01/07/2021  4:50 PM CDT -----  Regarding: RE: ICD  Brett Canales: Yes he will need an echo prior to seeing Dr. Wallene Huh.  He will not need to see Dr. Wallene Huh if his ejection fraction increases significantly.  Thanks.  SBG  ----- Message -----  From: Weston Brass  Sent: 01/07/2021   4:36 PM CDT  To: Hester Mates, MD  Subject: RE: ICD                                          Scheduled with RAD 11/23 does he need echo prior to appt?  Thanks   Brett Canales  ----- Message -----  From: Hester Mates, MD  Sent: 01/06/2021   3:17 PM CDT  To: Weston Brass  Subject: RE: ICD                                          Brett Canales: Either is good.  Thanks.  SBG  ----- Message -----  From: Weston Brass  Sent: 01/06/2021  11:12 AM CDT  To: Hester Mates, MD  Subject: ICD                                               He now wants be considered for an internal cardioverter defibrillator.  Would you like me to get him in with [facility] or RAD?      Thanks  Brett Canales

## 2021-01-21 ENCOUNTER — Encounter: Admit: 2021-01-21 | Discharge: 2021-01-21 | Payer: MEDICARE | Primary: Family

## 2021-01-21 NOTE — Telephone Encounter
01-21-21  Previously seen by CVM.  No records requested.  tlv

## 2021-02-02 ENCOUNTER — Ambulatory Visit: Admit: 2021-02-02 | Discharge: 2021-02-02 | Payer: MEDICARE | Primary: Family

## 2021-02-02 ENCOUNTER — Encounter: Admit: 2021-02-02 | Discharge: 2021-02-02 | Payer: MEDICARE | Primary: Family

## 2021-02-02 DIAGNOSIS — R609 Edema, unspecified: Secondary | ICD-10-CM

## 2021-02-02 DIAGNOSIS — I429 Cardiomyopathy, unspecified: Secondary | ICD-10-CM

## 2021-02-02 DIAGNOSIS — I200 Unstable angina: Secondary | ICD-10-CM

## 2021-02-02 DIAGNOSIS — Z86718 Personal history of other venous thrombosis and embolism: Secondary | ICD-10-CM

## 2021-02-02 MED ORDER — PERFLUTREN LIPID MICROSPHERES 1.1 MG/ML IV SUSP
1-10 mL | Freq: Once | INTRAVENOUS | 0 refills | Status: CP | PRN
Start: 2021-02-02 — End: ?

## 2021-02-09 ENCOUNTER — Encounter: Admit: 2021-02-09 | Discharge: 2021-02-09 | Payer: MEDICARE | Primary: Family

## 2021-02-09 NOTE — Telephone Encounter
-----   Message from Hester Mates, MD sent at 02/04/2021  1:33 PM CST -----  To all: Improved LV systolic function and decreased left ventricular volumes.  Good news.  Please let him know.  Thanks.  SBG  ----- Message -----  From: Levora Angel, MD  Sent: 02/04/2021   1:17 PM CST  To: Hester Mates, MD

## 2021-03-04 IMAGING — CR [ID]
3 series · 3 of 3 positions shown · non-contrast
Comparison: none

[cspine lat]
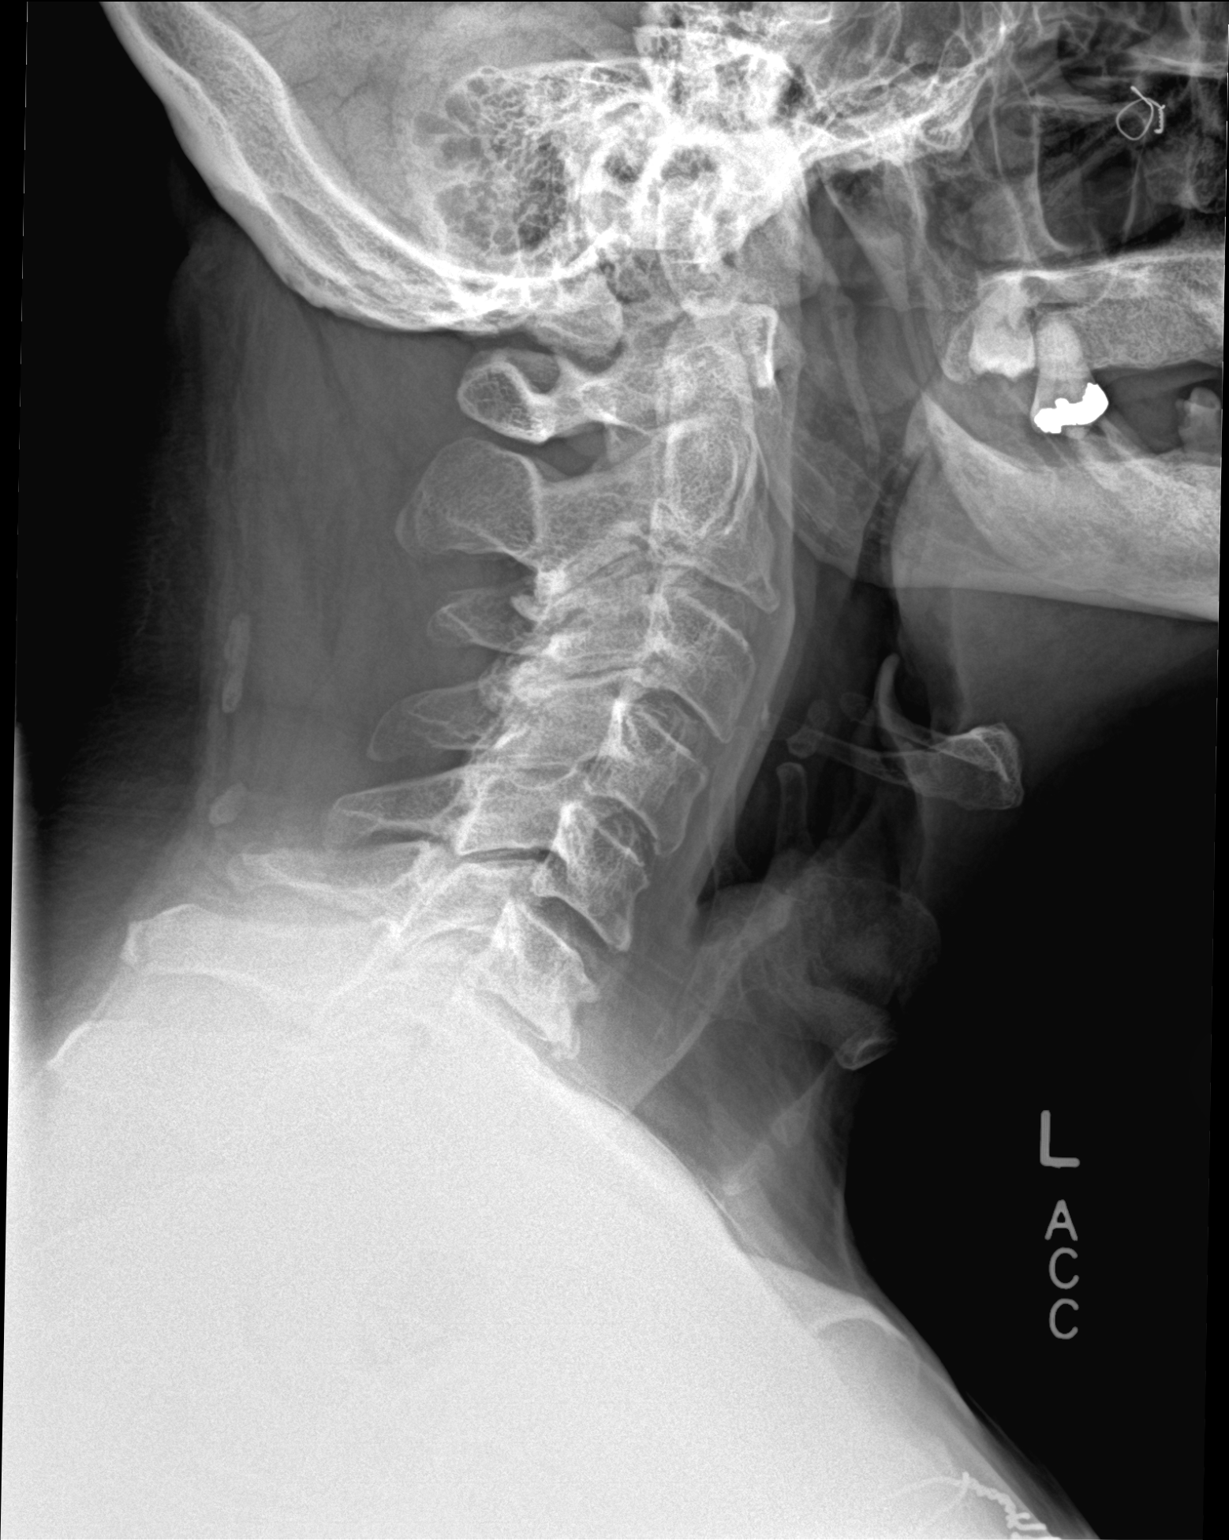

[cspine ap]
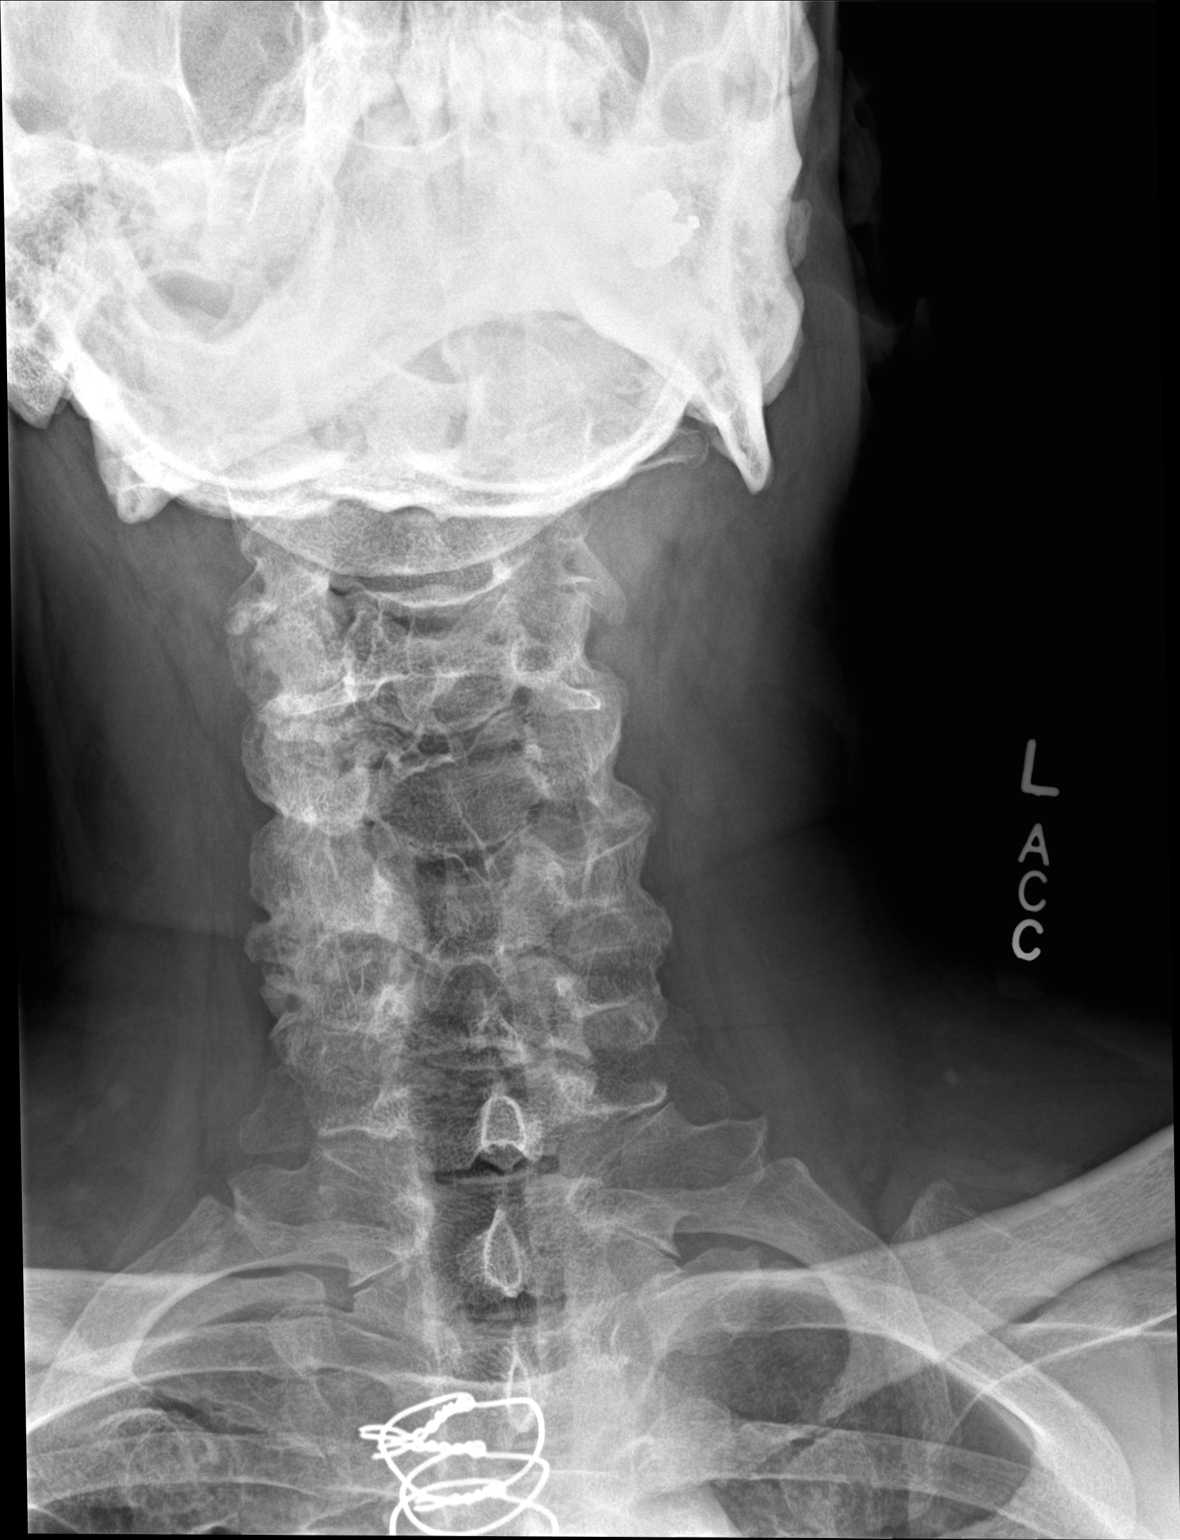

[cspine odontoid]
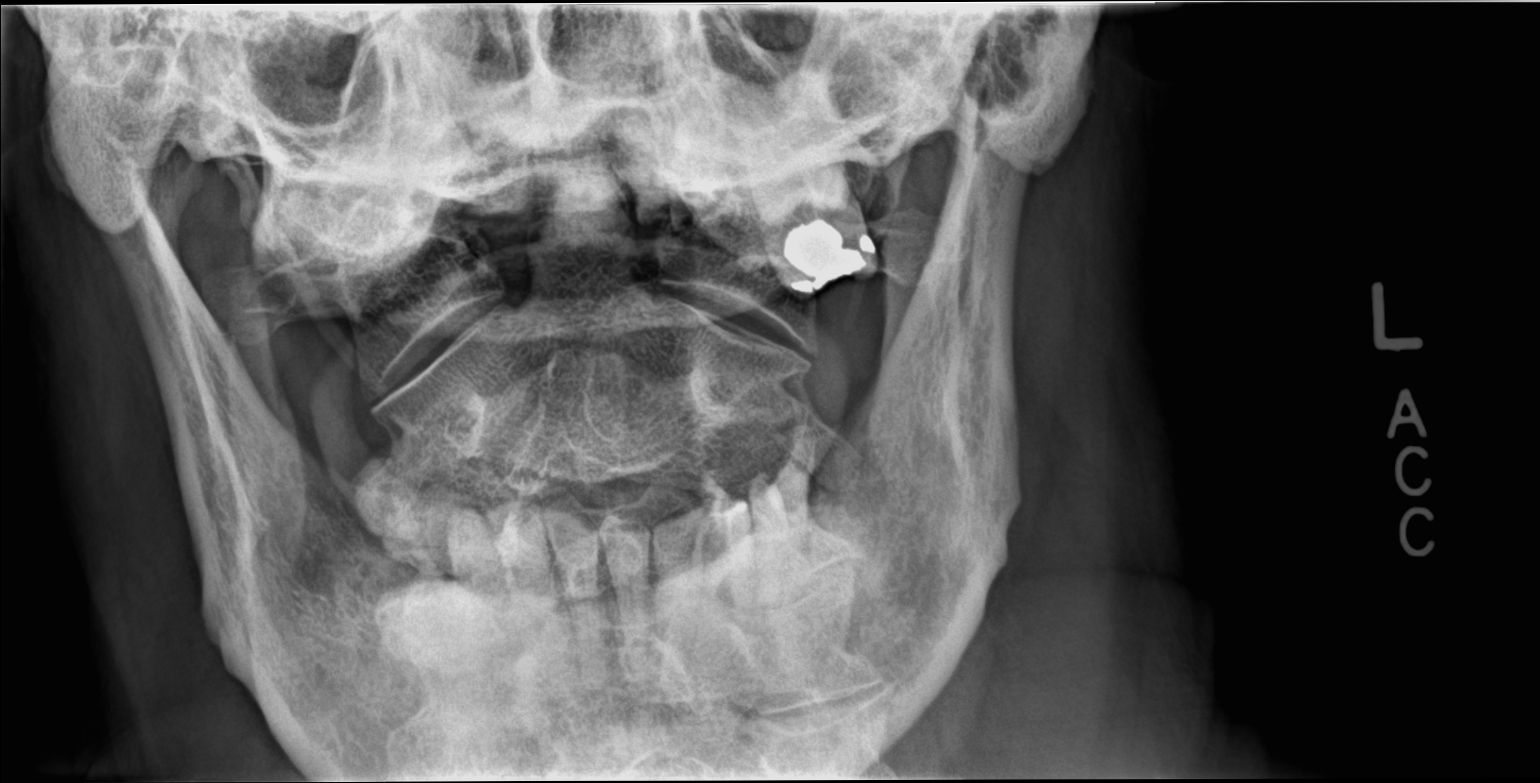

[3 of 3 positions shown; findings below may reference images not displayed]

EXAM

XR cervical spine 2V

INDICATION

FALL NECK PAIN
Neck pain, fell backwards three nights ago a hit his shoulder and head.

TECHNIQUE

Three views of the cervical spine

COMPARISONS

None available at the time of dictation.

FINDINGS

No endplate compression fracture, spondylolisthesis, or spondylolysis.) Left mild multilevel facet
arthropathy with bony hypertrophy most conspicuous at C5-6 and C6-7. Lateral masses of C1 are well
situated over C2. No prevertebral soft tissue swelling.

IMPRESSION
1. No radiographic evidence of an acute osseous abnormality.
2. Degenerative change of the cervical spine.

Tech Notes:

Neck pain, fell backwards three nights ago a hit his shoulder and head.

## 2021-03-04 IMAGING — CR PAPERORDER
3 series · 3 of 3 positions shown · non-contrast
Comparison: none

[hip ap pelvis]
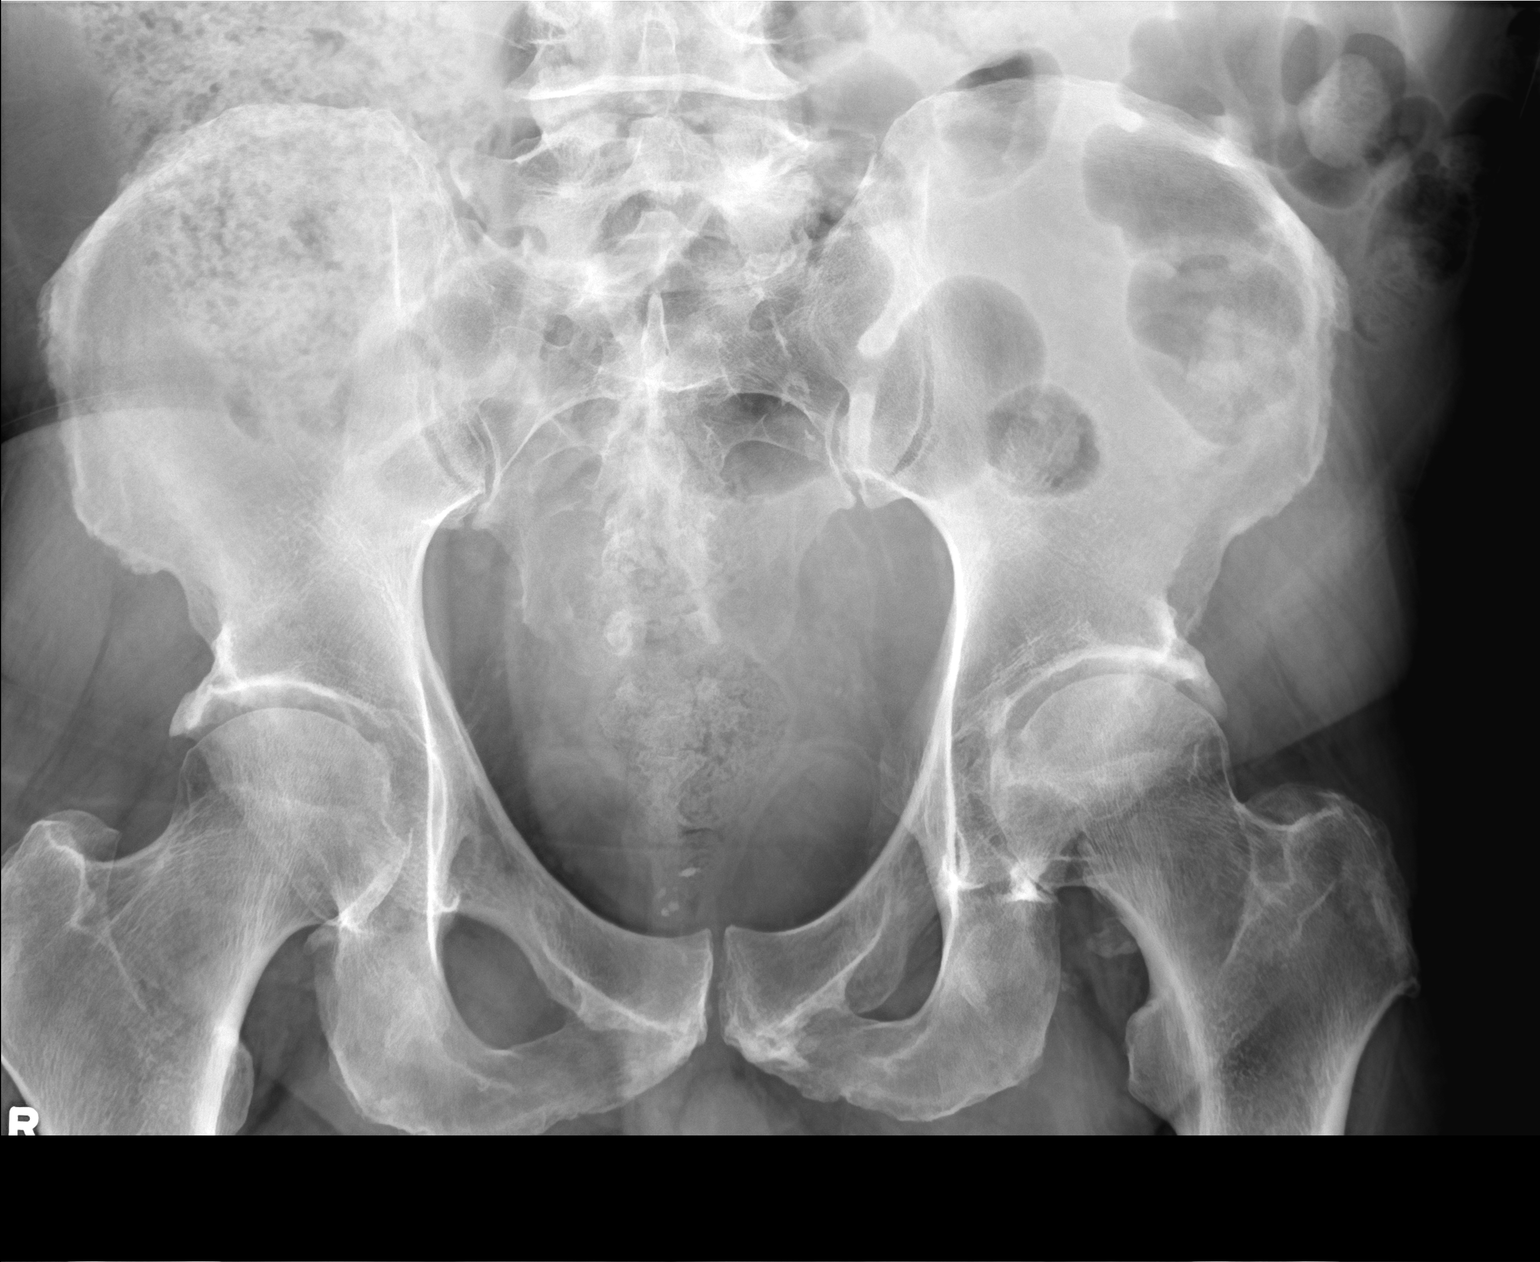

[hip ap]
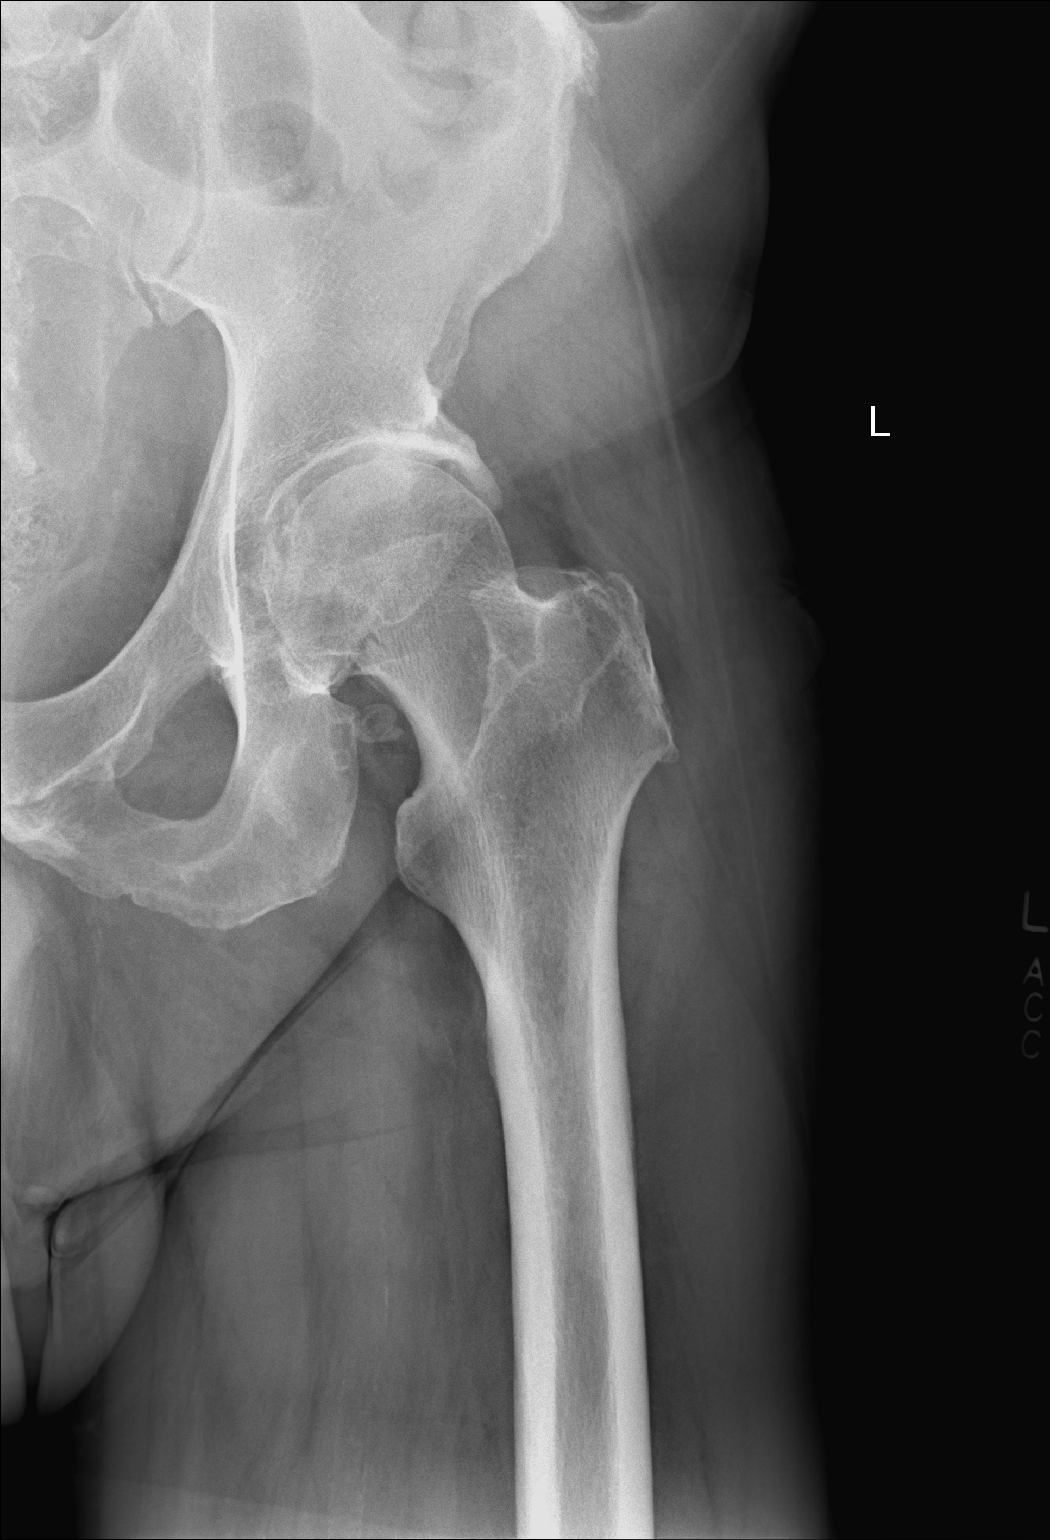

[hip frog lat]
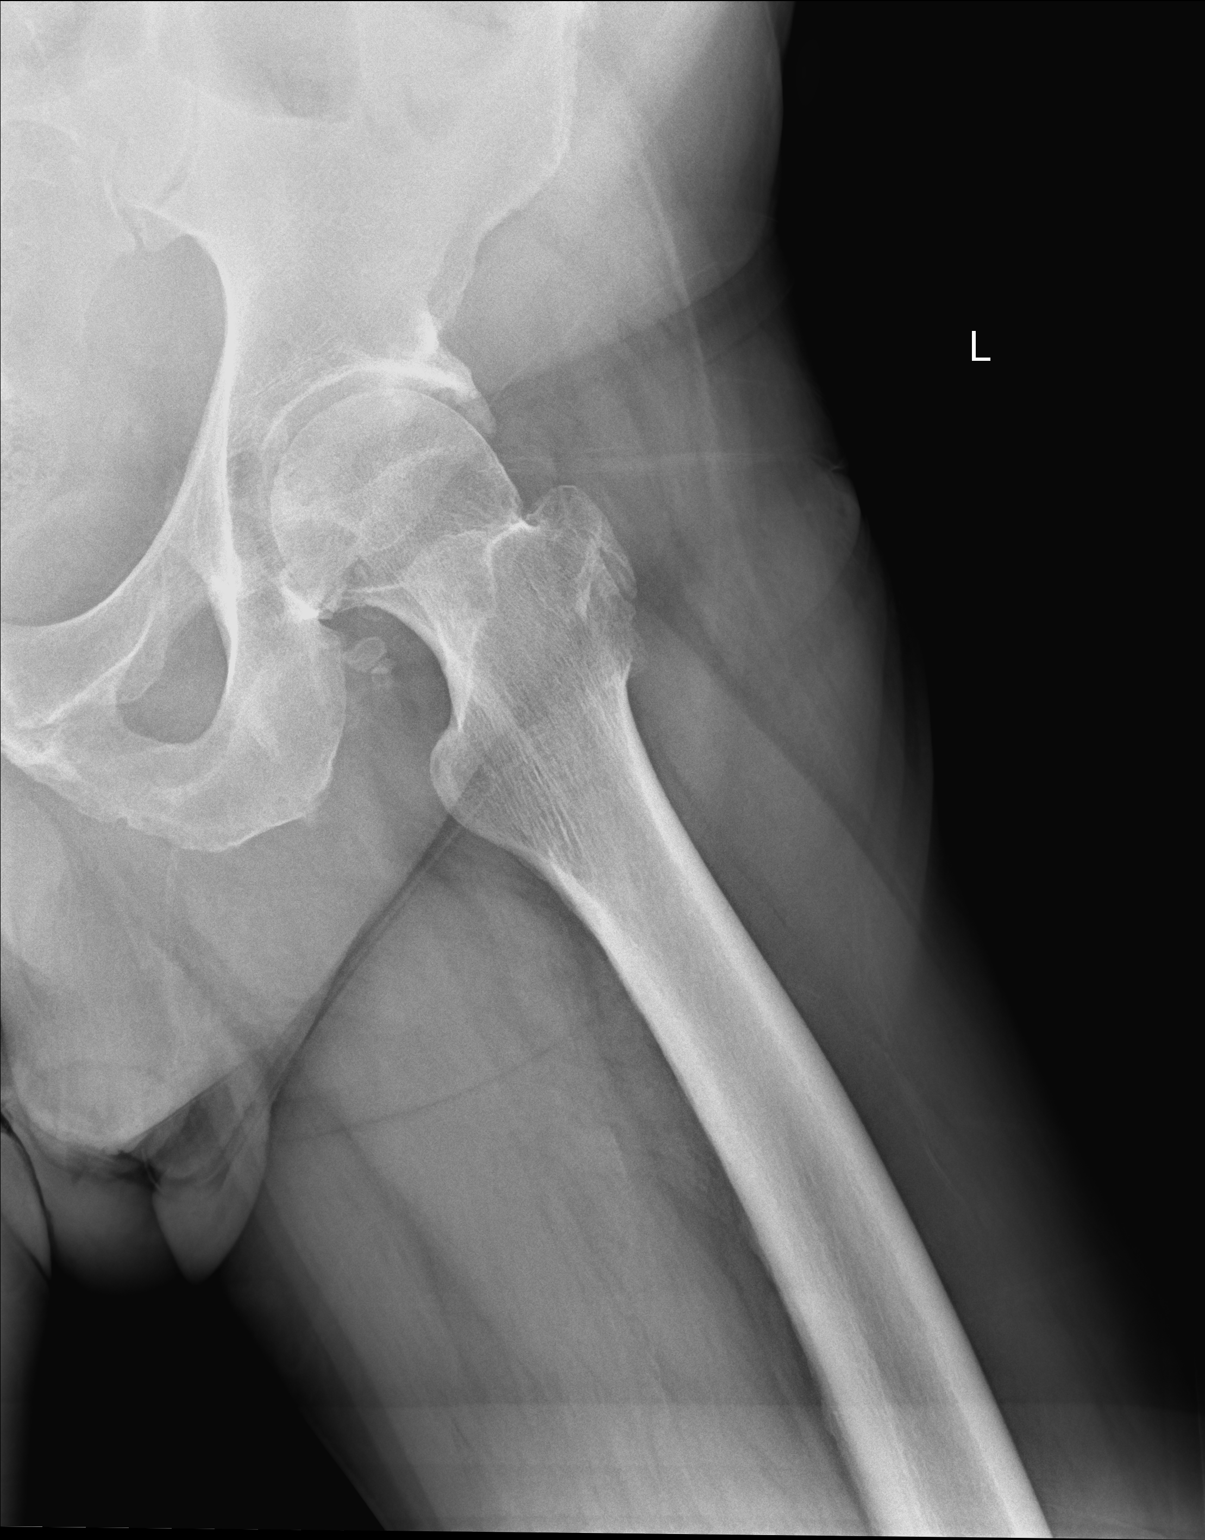

[3 of 3 positions shown; findings below may reference images not displayed]

EXAM

XR hip LT, 2-3V w or wo pelvis

INDICATION

Left hip pain, fell backwards three nights ago and landed on hip.

TECHNIQUE

AP view of the pelvis with AP and frogleg lateral view of the left hip

COMPARISONS

None available at the time of dictation.

FINDINGS

Appearance of bilateral, left greater than right acetabular over coverage related to marginal
osteophytosis. Mild left superior hip joint space loss. Minimal phleboliths in the pelvis. Mild st
ool burden.

IMPRESSION
1. Mild degenerative change of the hips with acetabular over coverage.

Tech Notes:

Left hip pain, fell backwards three nights ago and landed on hip.

## 2021-03-04 IMAGING — CR SHOULDCMRT
4 series · 4 of 4 positions shown · non-contrast
Comparison: none

[shoulder external]
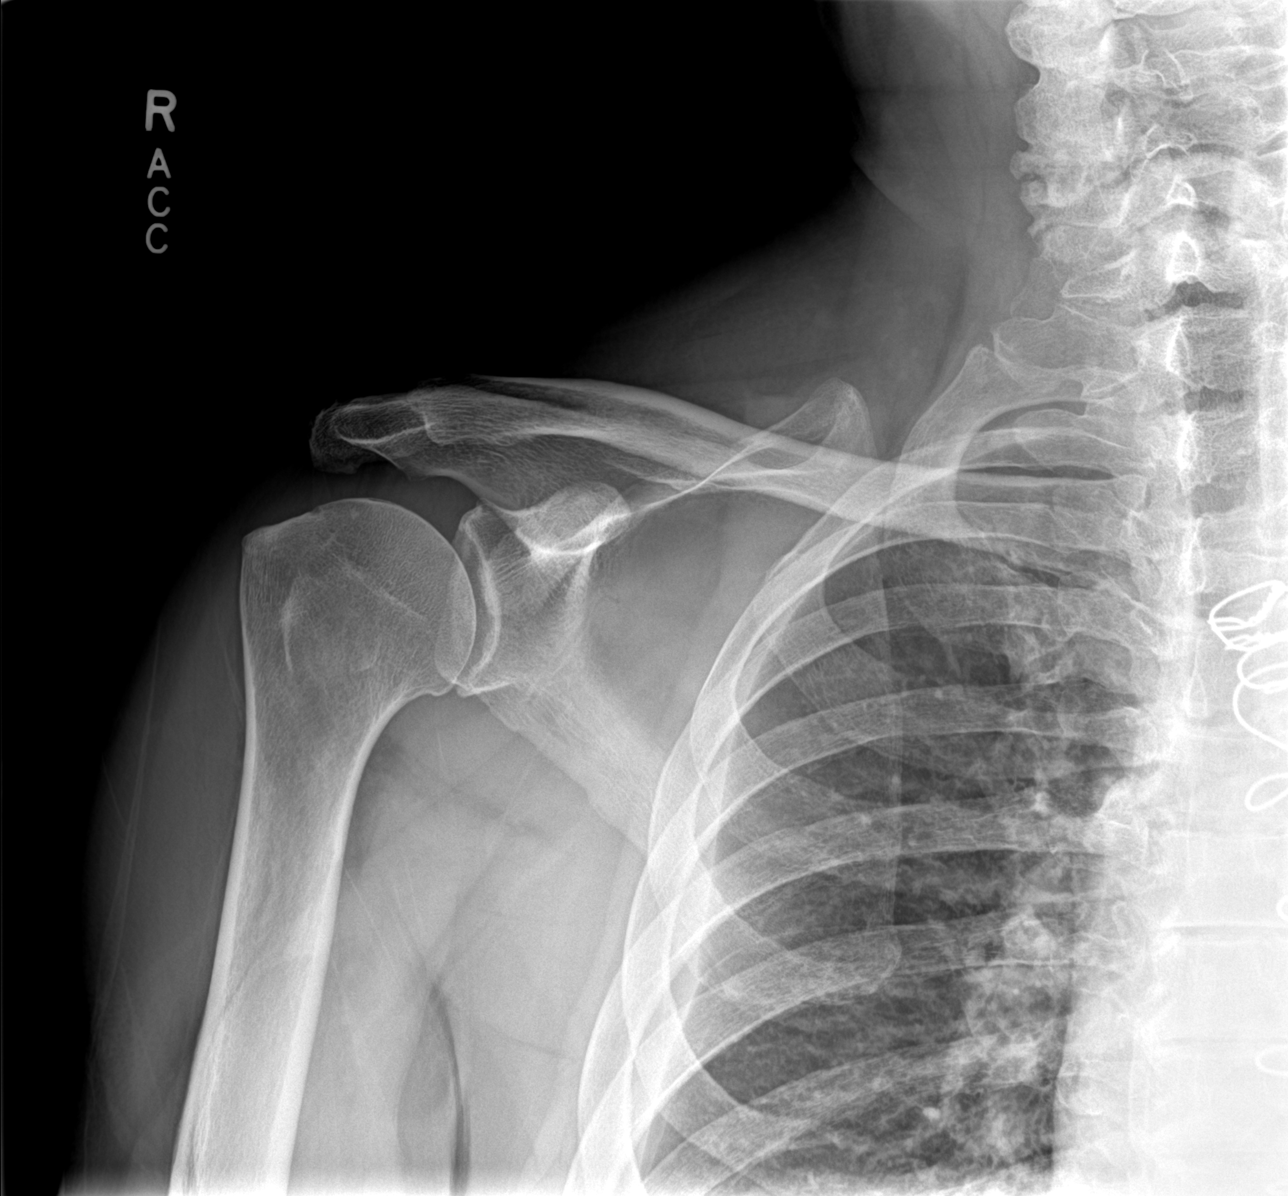

[shoulder internal]
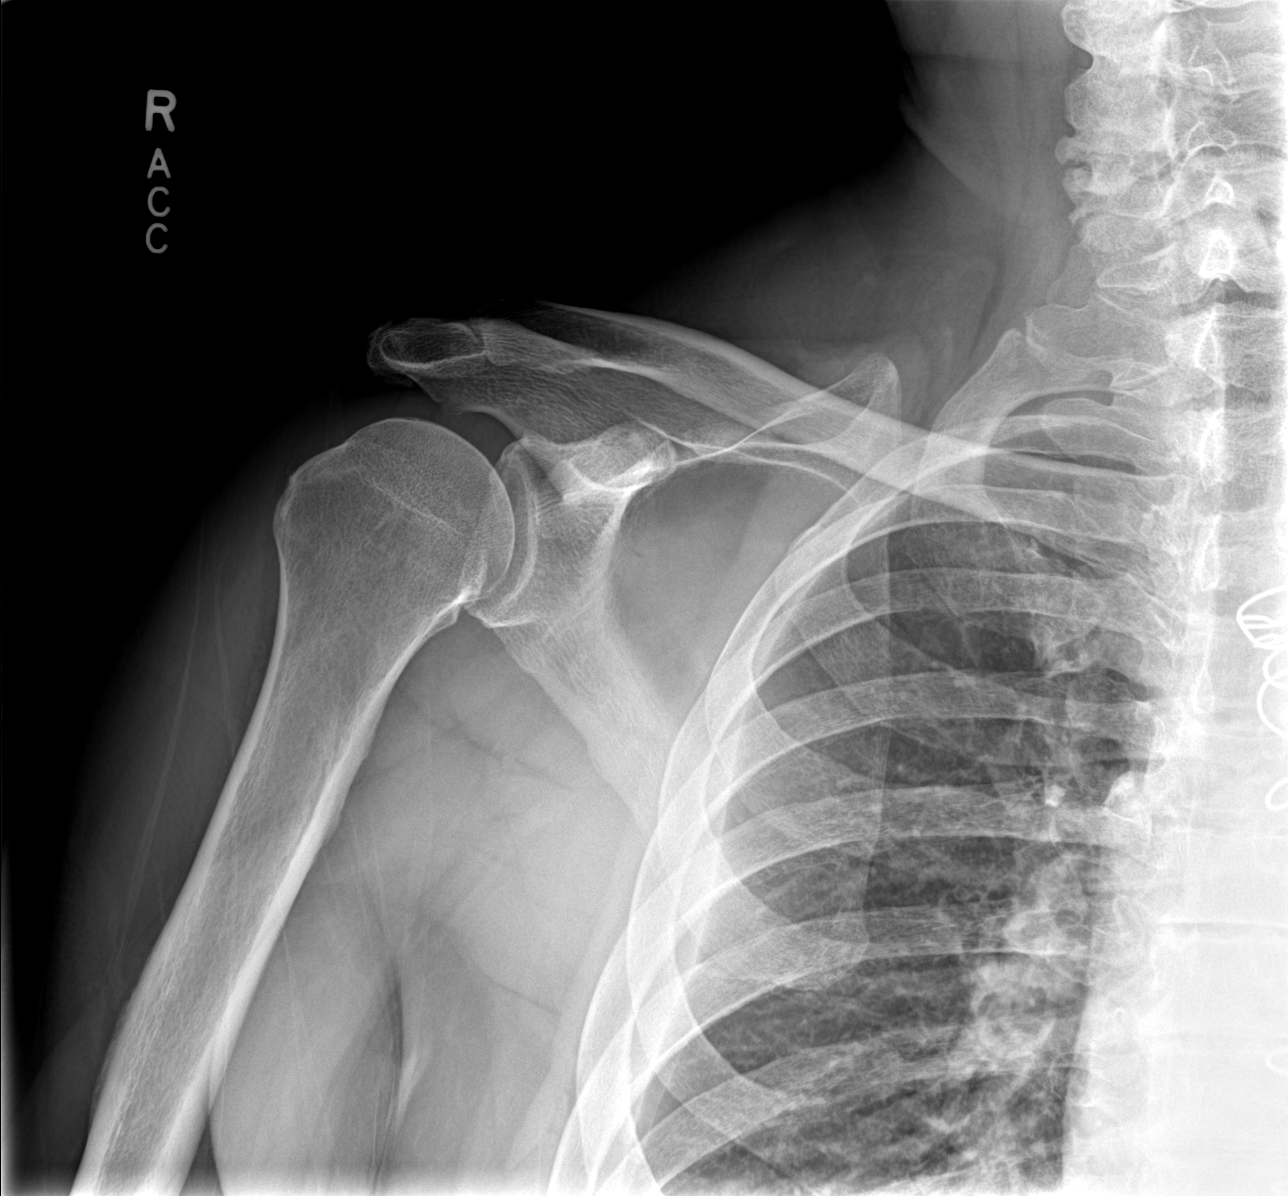

[shoulder y-view]
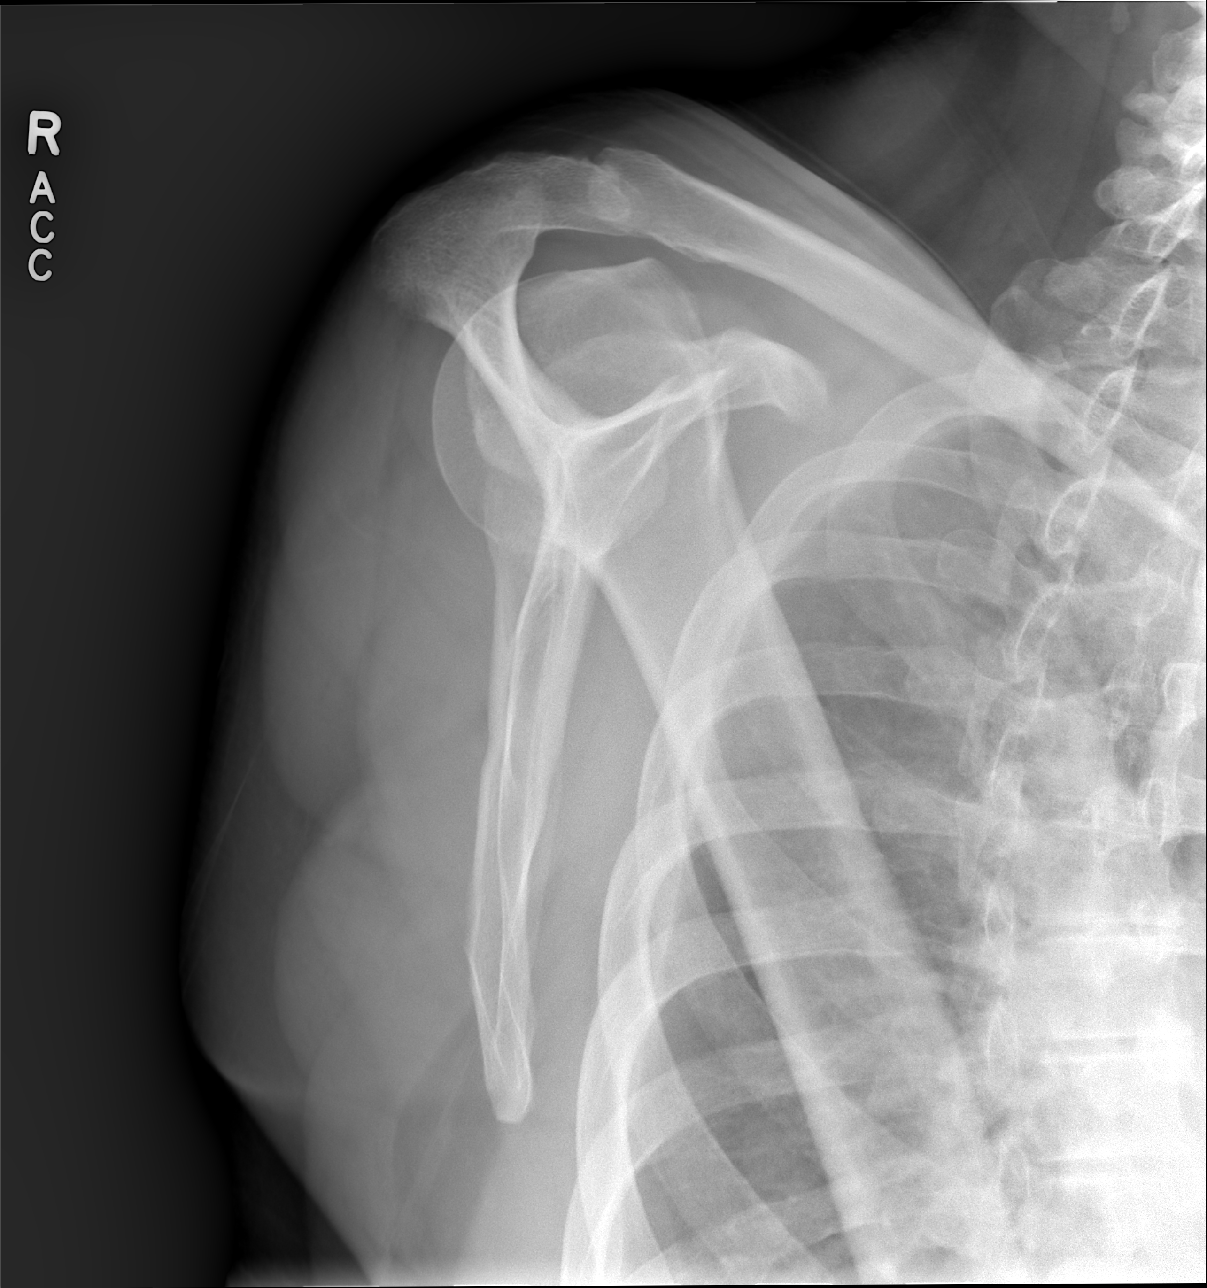

[shoulder axillary]
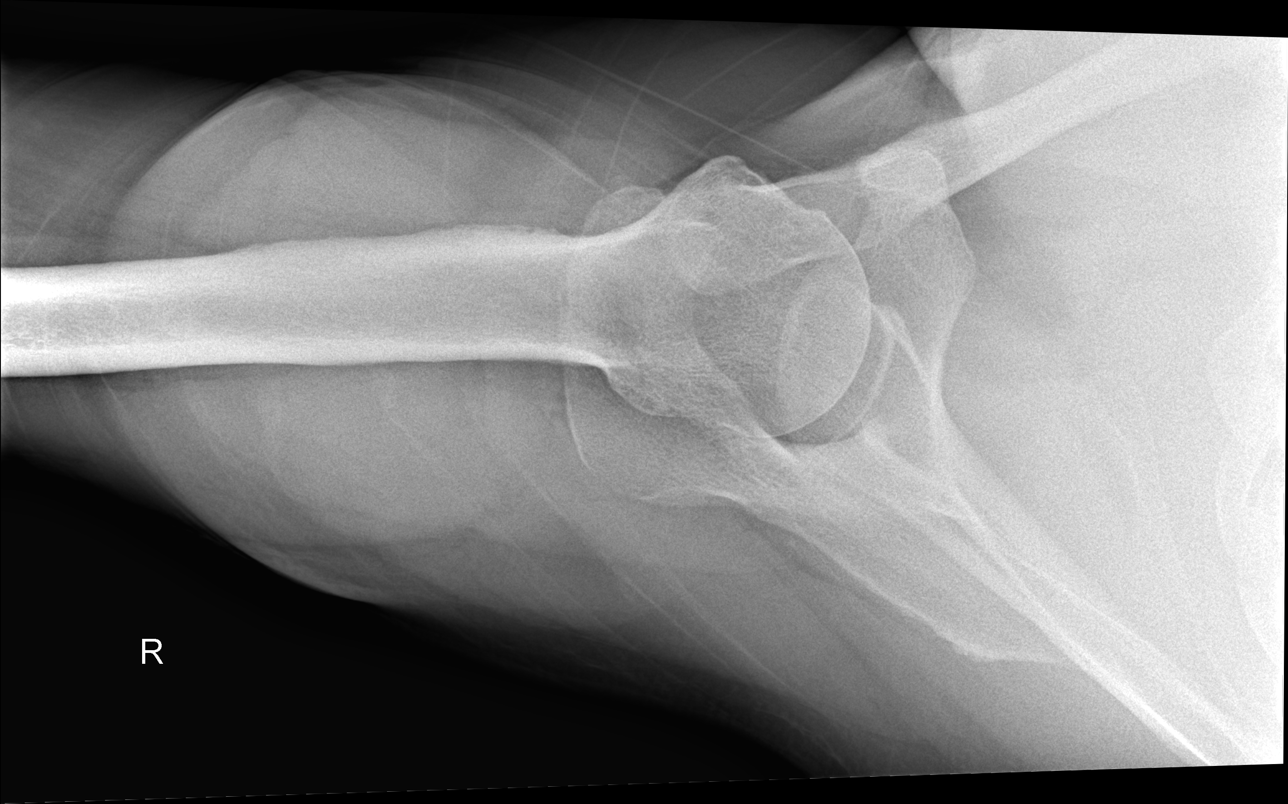

[4 of 4 positions shown; findings below may reference images not displayed]

EXAM

XR shoulder right, complete

INDICATION

FALL SHOULDER PAIN
Right shoulder pain, fell backwards three nights ago a hit his shoulder.

TECHNIQUE

Four views of the right shoulder

COMPARISONS

None available at the time of dictation.

FINDINGS

Minimal degenerative change of the right glenohumeral joint with asymmetric inferior joint space
loss. Sternotomy wires.There is anatomic joint alignment.

IMPRESSION
1. Minimal degenerative change of the right glenohumeral joint.

Tech Notes:

Right shoulder pain, fell backwards three nights ago a hit his shoulder.

## 2021-03-06 ENCOUNTER — Encounter: Admit: 2021-03-06 | Discharge: 2021-03-06 | Payer: MEDICARE | Primary: Family

## 2021-03-06 NOTE — Telephone Encounter
Pt calling for refill of dapagliflozin (FARXIGA) 10 mg tablet    Was referred to pcp in oct.  Called pcp and requested follow-up.

## 2021-04-10 ENCOUNTER — Encounter: Admit: 2021-04-10 | Discharge: 2021-04-10 | Payer: MEDICARE | Primary: Family

## 2021-04-10 DIAGNOSIS — E785 Hyperlipidemia, unspecified: Secondary | ICD-10-CM

## 2021-04-10 DIAGNOSIS — I2510 Atherosclerotic heart disease of native coronary artery without angina pectoris: Secondary | ICD-10-CM

## 2021-06-08 ENCOUNTER — Encounter: Admit: 2021-06-08 | Discharge: 2021-06-08 | Payer: MEDICARE | Primary: Family

## 2021-06-08 DIAGNOSIS — I10 Essential (primary) hypertension: Secondary | ICD-10-CM

## 2021-06-08 DIAGNOSIS — R609 Edema, unspecified: Secondary | ICD-10-CM

## 2021-06-08 MED ORDER — EPLERENONE 25 MG PO TAB
25 mg | ORAL_TABLET | Freq: Every day | ORAL | 1 refills | 90.00000 days | Status: AC
Start: 2021-06-08 — End: ?

## 2021-06-08 NOTE — Telephone Encounter
Hester Mates, MD  Rogelia Boga, RN; Idalia Needle, RN  Caller: Unspecified (Today, 1:44 PM)  To all: I recommend that he stop spironolactone and start eplerenone 25 mg daily. Please obtain a Chem-7. Thanks. SBG       Pt is agreeable to plan.  Med list updated.  New script sent to pharmacy.  Lab order faxed to [facility] hospital in [place] per patient request.

## 2021-06-08 NOTE — Telephone Encounter
Allen Lloyd called into the nursing triage line with complaints of nipple pain.  He states that for the past month he has been having more pain in his left nipple and he is starting to develop some pain/soreness in his right nipple.  He denies any discharge, redness or itching. He denies any chest pain or shortness of breath.  He is taking his medications as prescribed, including spirolactone 25mg  daily.  His BPs are running around 130/85, he does not keep track of his heart rate.  His weight is stable around 229 pounds.    Jamy has a follow up appointment scheduled on 4/11 with Dr. 6/11.    Will route to Dr. Arna Medici for recommendations.

## 2021-06-11 ENCOUNTER — Encounter: Admit: 2021-06-11 | Discharge: 2021-06-11 | Payer: MEDICARE | Primary: Family

## 2021-06-11 DIAGNOSIS — I2510 Atherosclerotic heart disease of native coronary artery without angina pectoris: Secondary | ICD-10-CM

## 2021-06-11 DIAGNOSIS — E785 Hyperlipidemia, unspecified: Secondary | ICD-10-CM

## 2021-06-12 ENCOUNTER — Encounter: Admit: 2021-06-12 | Discharge: 2021-06-12 | Payer: MEDICARE | Primary: Family

## 2021-06-12 NOTE — Progress Notes
Attempted to contact patient by phone regarding labs ordered per Dr. Dedra Skeens for Lipid Panel and BMP that have not been completed. No answer on patient's phone. Left message on patient's voicemail to return call to the clinic. Faxed lab orders for BMP and Lipid Panel to [facility] (FAX: [phone]). Patient has previously had labs drawn at this facility.

## 2021-06-16 ENCOUNTER — Encounter: Admit: 2021-06-16 | Discharge: 2021-06-16 | Payer: MEDICARE | Primary: Family

## 2021-06-16 DIAGNOSIS — I2510 Atherosclerotic heart disease of native coronary artery without angina pectoris: Secondary | ICD-10-CM

## 2021-06-16 DIAGNOSIS — E785 Hyperlipidemia, unspecified: Secondary | ICD-10-CM

## 2021-06-16 DIAGNOSIS — I10 Essential (primary) hypertension: Secondary | ICD-10-CM

## 2021-06-16 DIAGNOSIS — R609 Edema, unspecified: Secondary | ICD-10-CM

## 2021-06-16 LAB — LIPID PROFILE
CHOLESTEROL/HDL %: 2
CHOLESTEROL: 101
TRIGLYCERIDES: 111
VLDL: 22

## 2021-06-16 LAB — BASIC METABOLIC PANEL
ANION GAP: 8
CALCIUM: 9.5
CO2: 27
CREATININE: 1.1
GLUCOSE,PANEL: 219 — ABNORMAL HIGH (ref 70–105)
POTASSIUM: 4
SODIUM: 139

## 2021-06-18 ENCOUNTER — Encounter: Admit: 2021-06-18 | Discharge: 2021-06-18 | Payer: MEDICARE | Primary: Family

## 2021-06-18 NOTE — Telephone Encounter
-----   Message from Hester Mates, MD sent at 06/17/2021  4:40 PM CDT -----  Cletis [place]: His renal function and potassium look stable.  His glucose is elevated.  Please let the patient know and forward labs to his primary care physician.  Thanks.  SBG  ----- Message -----  From: Florene Route, RN  Sent: 06/16/2021   9:32 AM CDT  To: Hester Mates, MD    Labs for your review and recommendations after switching from Spironolactone 25mg  to Eplerenone 25mg  daily. Patient is currently on Lipitor 80mg  daily. Patient is scheduled with you 06/23/21. Thank you!

## 2021-06-18 NOTE — Telephone Encounter
Discussed recommendations with patient. Patient states that he has been working with PCP to manage blood sugars. Labs will be faxed to PCP office for review. Patient has no further questions at this time.

## 2021-06-23 ENCOUNTER — Encounter: Admit: 2021-06-23 | Discharge: 2021-06-23 | Payer: MEDICARE | Primary: Family

## 2021-06-23 DIAGNOSIS — Z86718 Personal history of other venous thrombosis and embolism: Secondary | ICD-10-CM

## 2021-06-23 DIAGNOSIS — I208 Other forms of angina pectoris: Secondary | ICD-10-CM

## 2021-06-23 DIAGNOSIS — E785 Hyperlipidemia, unspecified: Secondary | ICD-10-CM

## 2021-06-23 DIAGNOSIS — I739 Peripheral vascular disease, unspecified: Secondary | ICD-10-CM

## 2021-06-23 DIAGNOSIS — I429 Cardiomyopathy, unspecified: Secondary | ICD-10-CM

## 2021-06-23 DIAGNOSIS — Z136 Encounter for screening for cardiovascular disorders: Secondary | ICD-10-CM

## 2021-06-23 DIAGNOSIS — I2510 Atherosclerotic heart disease of native coronary artery without angina pectoris: Secondary | ICD-10-CM

## 2021-06-23 DIAGNOSIS — I10 Essential (primary) hypertension: Secondary | ICD-10-CM

## 2021-06-23 NOTE — Patient Instructions
Thank you for visiting our office today.    We would like to make the following medication adjustments:  NONE      Otherwise continue the same medications as you have been doing.          We will be pursuing the following tests after your appointment today:            We will plan to see you back in 6 months.  Please call us in the meantime with any questions or concerns.        Please allow 5-7 business days for our providers to review your results. All normal results will go to MyChart. If you do not have Mychart, it is strongly recommended to get this so you can easily view all your results. If you do not have mychart, we will attempt to call you once with normal lab and testing results. If we cannot reach you by phone with normal results, we will send you a letter.  If you have not heard the results of your testing after one week please give us a call.       Your Cardiovascular Medicine [place] Team (Steve, Lisa, Jamie, Melanie, and Nusaiba Guallpa)  phone number is [phone].
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
## 2021-08-22 ENCOUNTER — Encounter: Admit: 2021-08-22 | Discharge: 2021-08-22 | Payer: MEDICARE | Primary: Family

## 2021-08-22 ENCOUNTER — Inpatient Hospital Stay: Admit: 2021-08-22 | Discharge: 2021-08-22 | Payer: MEDICARE | Primary: Family

## 2021-08-22 DIAGNOSIS — E119 Type 2 diabetes mellitus without complications: Secondary | ICD-10-CM

## 2021-08-22 DIAGNOSIS — Z86718 Personal history of other venous thrombosis and embolism: Secondary | ICD-10-CM

## 2021-08-22 DIAGNOSIS — I10 Essential (primary) hypertension: Secondary | ICD-10-CM

## 2021-08-22 DIAGNOSIS — R079 Chest pain, unspecified: Secondary | ICD-10-CM

## 2021-08-22 DIAGNOSIS — E785 Hyperlipidemia, unspecified: Secondary | ICD-10-CM

## 2021-08-22 DIAGNOSIS — I2510 Atherosclerotic heart disease of native coronary artery without angina pectoris: Secondary | ICD-10-CM

## 2021-08-22 DIAGNOSIS — I739 Peripheral vascular disease, unspecified: Secondary | ICD-10-CM

## 2021-08-22 DIAGNOSIS — I5020 Unspecified systolic (congestive) heart failure: Secondary | ICD-10-CM

## 2021-08-22 LAB — URINALYSIS MICROSCOPIC REFLEX TO CULTURE

## 2021-08-22 LAB — URINALYSIS DIPSTICK REFLEX TO CULTURE
LEUKOCYTES: NEGATIVE
NITRITE: NEGATIVE
URINE ASCORBIC ACID, UA: NEGATIVE
URINE BILE: NEGATIVE
URINE BLOOD: NEGATIVE
URINE KETONE: NEGATIVE

## 2021-08-22 LAB — LACTIC ACID(LACTATE): LACTIC ACID: 1.9 MMOL/L (ref 0.5–2.0)

## 2021-08-22 LAB — TSH WITH FREE T4 REFLEX: TSH: 0.5 uU/mL — ABNORMAL HIGH (ref 0.35–5.00)

## 2021-08-22 LAB — CBC AND DIFF
ABSOLUTE BASO COUNT: 0.1 K/UL (ref 0–0.20)
HEMATOCRIT: 46 % (ref 40–50)
HEMOGLOBIN: 15 g/dL (ref 13.5–16.5)
MCHC: 33 g/dL (ref 32.0–36.0)
MCV: 85 FL (ref 80–100)
MPV: 8.7 FL (ref 7–11)
PLATELET COUNT: 85 K/UL — ABNORMAL LOW (ref 150–400)
RDW: 15 % — ABNORMAL HIGH (ref 11–15)
WBC COUNT: 9.2 K/UL (ref 4.5–11.0)

## 2021-08-22 LAB — HIGH SENSITIVITY TROPONIN I 0 HOUR: HIGH SENSITIVITY TROPONIN I 0 HOUR: 30 ng/L — ABNORMAL HIGH (ref <20)

## 2021-08-22 LAB — BNP (B-TYPE NATRIURETIC PEPTI): BNP: 128 pg/mL — ABNORMAL HIGH (ref 0–100)

## 2021-08-22 LAB — POC GLUCOSE: POC GLUCOSE: 117 mg/dL — ABNORMAL HIGH (ref 70–100)

## 2021-08-22 IMAGING — CT ABDOMEN_PELVIS W(Adult)
2 of 3 series · 13 of 46 positions shown, 15 images · non-contrast
Comparison: none

[Series 2: abdomen_pelvis ax 3.00 br40 s3 · axial · 0.66mm/px · z∈[+1391,+1795]mm · 10 of 154 slices shown, 12 images]
[im 10/154  soft-tissue]
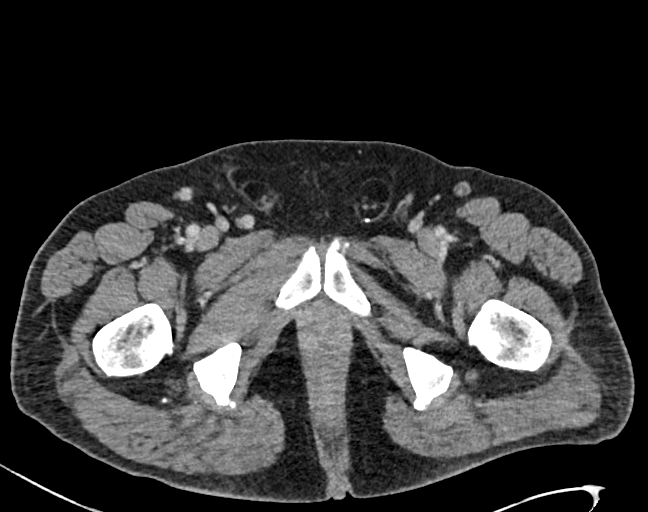
[im 10/154  bone]
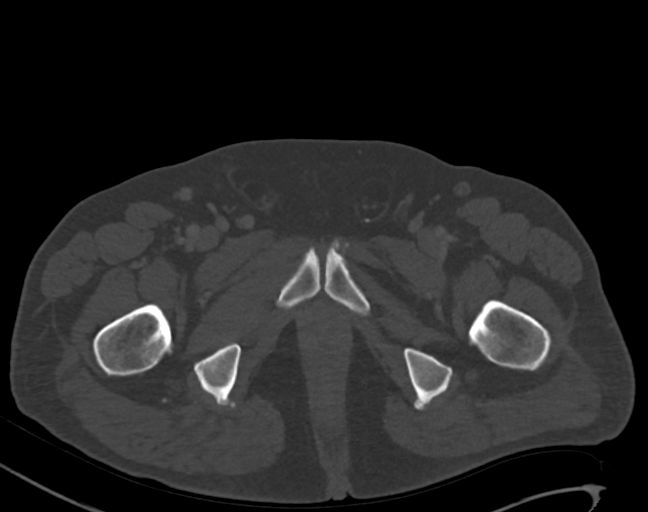
[im 25/154  soft-tissue]
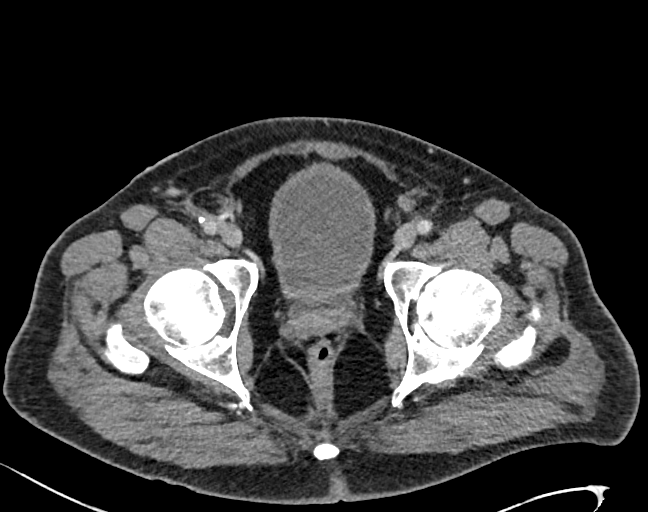
[im 40/154  soft-tissue]
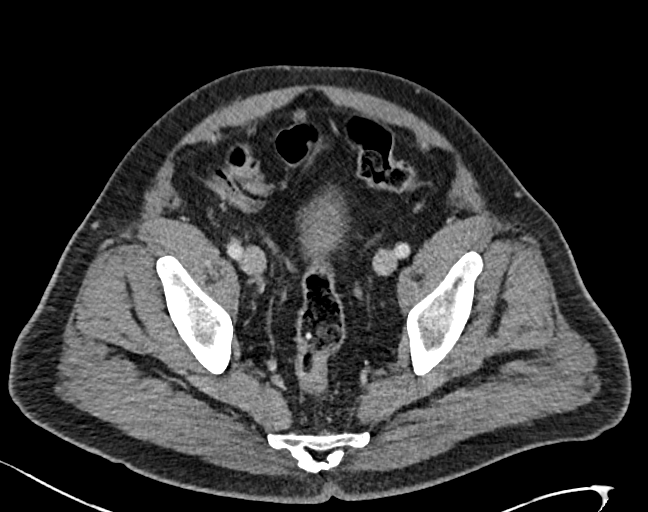
[im 55/154  soft-tissue]
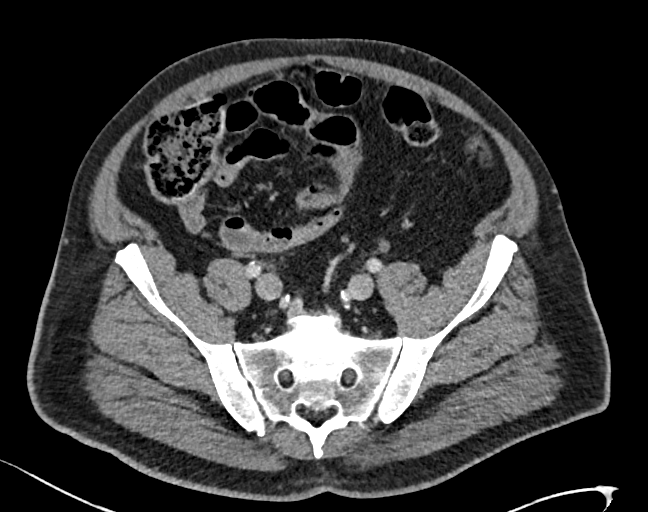
[im 70/154  soft-tissue]
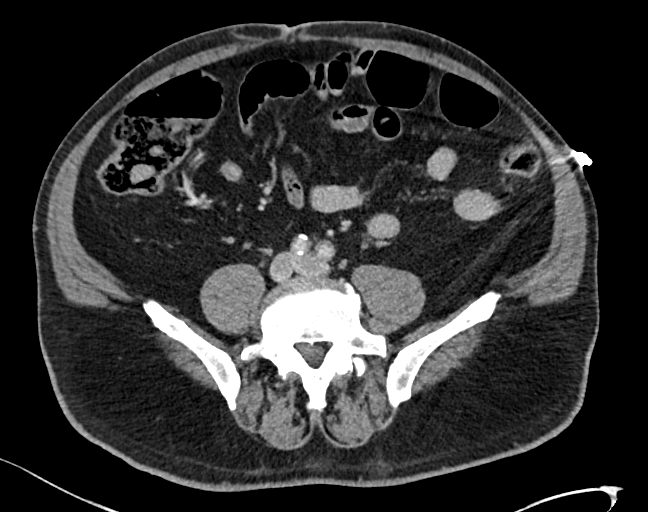
[im 84/154  soft-tissue]
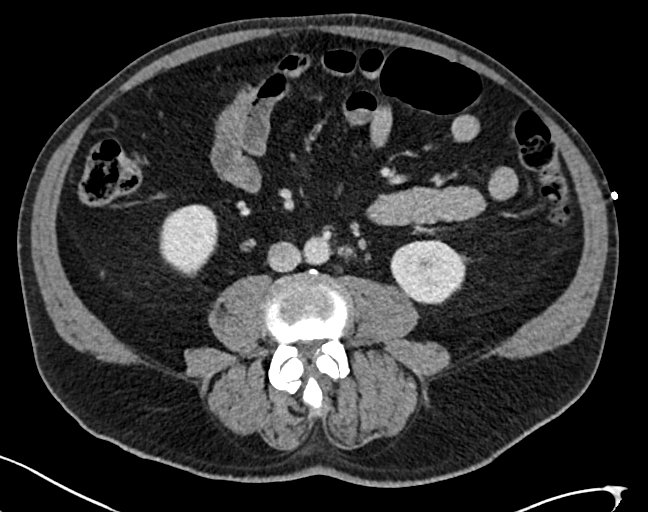
[im 99/154  soft-tissue]
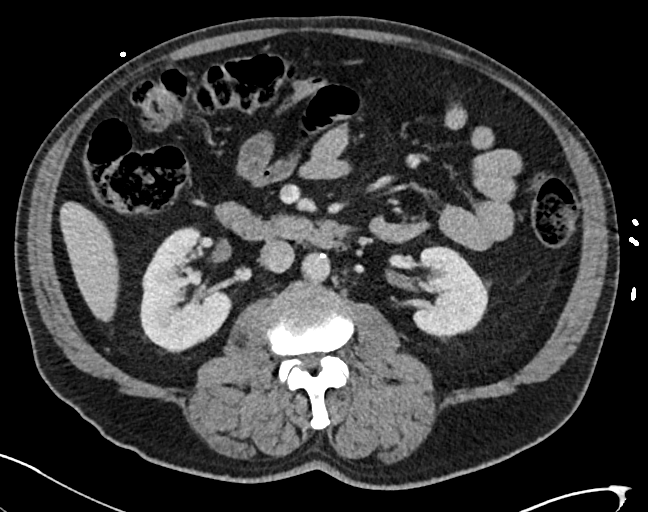
[im 114/154  soft-tissue]
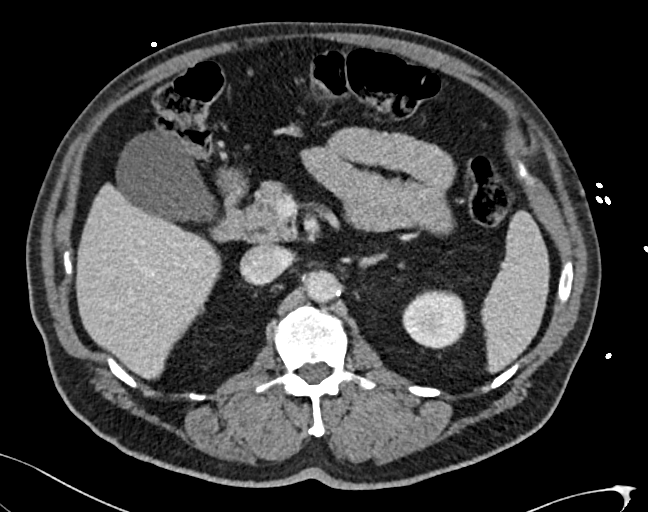
[im 129/154  soft-tissue]
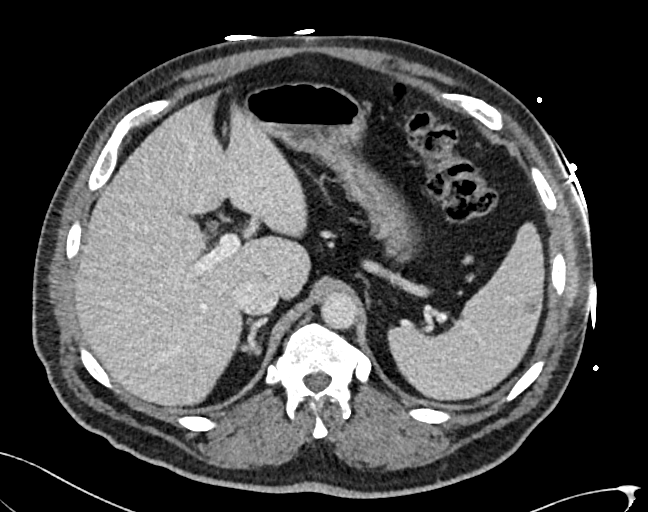
[im 129/154  bone]
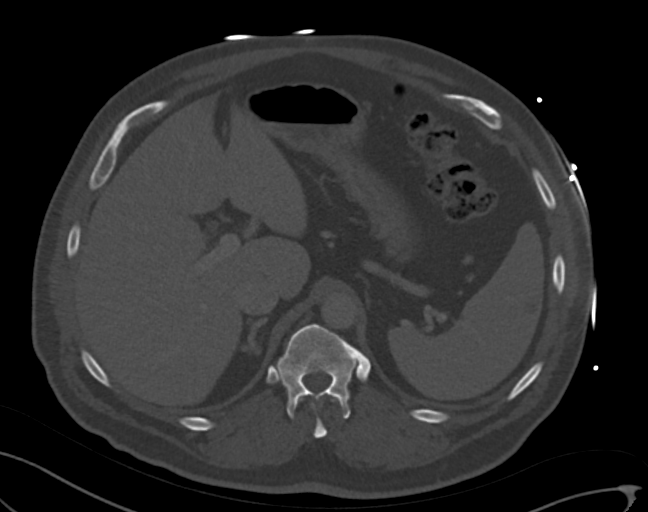
[im 144/154  soft-tissue]
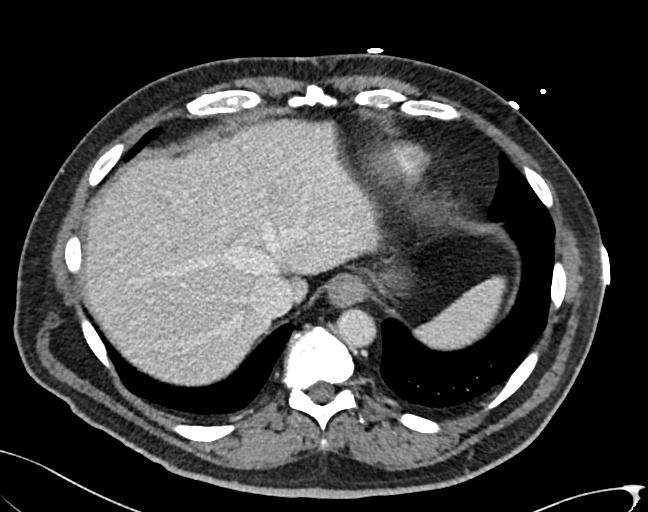

[Series 4: abdomen_pelvis cor 3.00 br40 s3 · coronal · 0.83mm/px · 3 of 108 slices shown]
[im 36/108  soft-tissue]
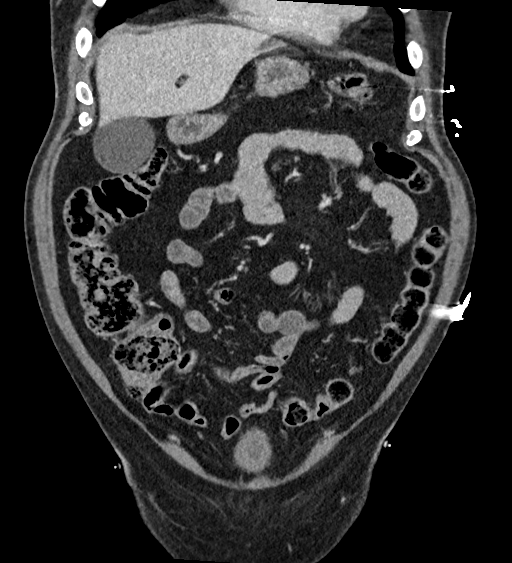
[im 48/108  soft-tissue]
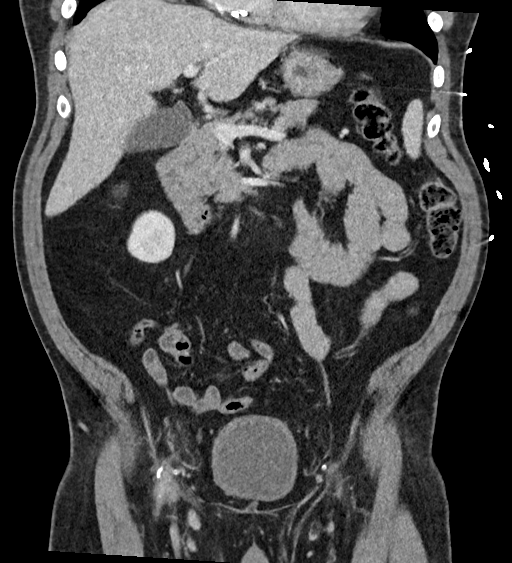
[im 60/108  soft-tissue]
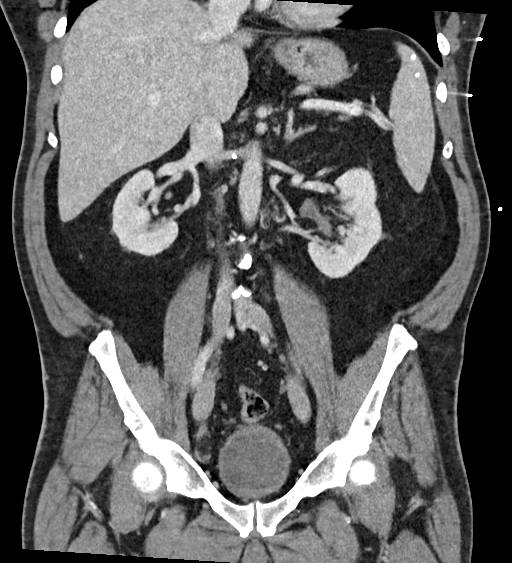

[13 of 46 positions shown; findings below may reference images not displayed]

DIAGNOSTIC STUDIES

EXAM

COMPUTED TOMOGRAPHY, ABDOMEN AND PELVIS; WITH CONTRAST MATERIAL CPT 93399

INDICATION

Epigastric and left upper quadrant pain.

TECHNIQUE

Contiguous axial tomographic images were obtained from the lung bases to the symphysis pubis with
both oral and intravenous contrast.

All CT scans at this facility use dose modulation, iterative reconstruction, and/or weight based
dosing when appropriate to reduce radiation dose to as low as reasonably achievable.

O CT and nuclear scans in the last year.

COMPARISONS

11/06/2017

FINDINGS

The heart appears normal in size. Mediastinal lipomatosis. No pericardial effusion. The lung bases
are clear. Hepatomegaly, 21 cm. The a patent dome was excluded. The spleen is mildly enlarged,
cm. The pancreas, gallbladder and adrenal glands appear grossly. No hydronephrosis. No bowel
obstruction or free air. Moderate amount of stool. Normal appendix. There is mild thickening of the
distal esophagus. Moderate gastric mucosal thickening. No significant small bowel dilatation.
Moderate atherosclerotic disease. Subcentimeter retroperitoneal nodes. There is mild thickening the
urinary bladder. Mildly enlarged prostate containing coarse calcifications. Moderate degenerative
changes within the lower lumbar spine.

IMPRESSION

1. Gastritis.

Follow-up with a gastroenterology consultation can be performed.

Tech Notes:

N/V, FEVER 103 DEGREES, EPIGASTRIC AND LUQ PAIN.  GFR = 56.9.  100 ML OMNIPAQUE 300 GIVEN.  PT
INSTRUCTED TO HOLD METFORMIN AFTER TEST.  AB
CT/NM: 0/0

## 2021-08-22 MED ORDER — INSULIN GLARGINE 100 UNIT/ML (3 ML) SC INJ PEN
20 [IU] | Freq: Every evening | SUBCUTANEOUS | 0 refills | Status: AC
Start: 2021-08-22 — End: ?

## 2021-08-22 MED ORDER — ATORVASTATIN 40 MG PO TAB
80 mg | Freq: Every day | ORAL | 0 refills | Status: AC
Start: 2021-08-22 — End: ?
  Administered 2021-08-23 – 2021-08-25 (×3): 80 mg via ORAL

## 2021-08-22 MED ORDER — CARVEDILOL 12.5 MG PO TAB
12.5 mg | Freq: Two times a day (BID) | ORAL | 0 refills | Status: AC
Start: 2021-08-22 — End: ?
  Administered 2021-08-23 – 2021-08-25 (×4): 12.5 mg via ORAL

## 2021-08-22 MED ORDER — SENNOSIDES-DOCUSATE SODIUM 8.6-50 MG PO TAB
1 | Freq: Every day | ORAL | 0 refills | Status: AC | PRN
Start: 2021-08-22 — End: ?

## 2021-08-22 MED ORDER — ACETAMINOPHEN 325 MG PO TAB
650 mg | ORAL | 0 refills | Status: DC | PRN
Start: 2021-08-22 — End: 2021-08-23

## 2021-08-22 MED ORDER — CLOPIDOGREL 75 MG PO TAB
75 mg | Freq: Every day | ORAL | 0 refills | Status: AC
Start: 2021-08-22 — End: ?
  Administered 2021-08-23 – 2021-08-25 (×3): 75 mg via ORAL

## 2021-08-22 MED ORDER — ACETAMINOPHEN 325 MG PO TAB
650 mg | ORAL | 0 refills | Status: AC | PRN
Start: 2021-08-22 — End: ?
  Administered 2021-08-23: 09:00:00 650 mg via ORAL

## 2021-08-22 MED ORDER — DEXTROSE 50 % IN WATER (D50W) IV SYRG
12.5-25 g | INTRAVENOUS | 0 refills | Status: AC | PRN
Start: 2021-08-22 — End: ?

## 2021-08-22 MED ORDER — MELATONIN 5 MG PO TAB
5 mg | Freq: Every evening | ORAL | 0 refills | Status: AC | PRN
Start: 2021-08-22 — End: ?

## 2021-08-22 MED ORDER — EZETIMIBE 10 MG PO TAB
10 mg | Freq: Every day | ORAL | 0 refills | Status: AC
Start: 2021-08-22 — End: ?
  Administered 2021-08-24 – 2021-08-25 (×2): 10 mg via ORAL

## 2021-08-22 MED ORDER — INSULIN ASPART 100 UNIT/ML SC FLEXPEN
0-6 [IU] | Freq: Before meals | SUBCUTANEOUS | 0 refills | Status: AC
Start: 2021-08-22 — End: ?

## 2021-08-22 MED ORDER — RANOLAZINE 500 MG PO TB12
1000 mg | Freq: Two times a day (BID) | ORAL | 0 refills | Status: AC
Start: 2021-08-22 — End: ?
  Administered 2021-08-23 – 2021-08-25 (×6): 1000 mg via ORAL

## 2021-08-22 NOTE — Progress Notes
Patient with extensive cardiac history is in ED with complaints of chest discomfort. EKG reveals no acute changes; however, his Troponin levels are on a rise. Patient was febrile on admission and his lactic acid is elevated at 3.2. Rocephin and Flagyl administered, IV fluids and Heparin gtt initiated.

## 2021-08-23 ENCOUNTER — Inpatient Hospital Stay: Admit: 2021-08-23 | Discharge: 2021-08-23 | Payer: MEDICARE | Primary: Family

## 2021-08-23 ENCOUNTER — Inpatient Hospital Stay: Admit: 2021-08-23 | Payer: MEDICARE

## 2021-08-23 ENCOUNTER — Encounter: Admit: 2021-08-23 | Discharge: 2021-08-23 | Payer: MEDICARE | Primary: Family

## 2021-08-23 LAB — BENZODIAZEPINES-URINE RANDOM: BENZODIAZEPINES: NEGATIVE % (ref 0–5)

## 2021-08-23 LAB — COCAINE-URINE RANDOM: COCAINE: NEGATIVE K/UL — ABNORMAL HIGH (ref 1.8–7.0)

## 2021-08-23 LAB — CANNABINOIDS-URINE RANDOM: CANNABINOIDS (THC): NEGATIVE % (ref 0–2)

## 2021-08-23 LAB — BARBITURATES-URINE RANDOM: BARBITURATES: NEGATIVE % — ABNORMAL LOW (ref 4–12)

## 2021-08-23 LAB — OPIATES-URINE RANDOM: OPIATES: NEGATIVE K/UL — ABNORMAL LOW (ref 1.0–4.8)

## 2021-08-23 LAB — FENTANYL URINE: FENTANYL URINE: NEGATIVE K/UL (ref 0–0.45)

## 2021-08-23 LAB — AMPHETAMINES-URINE RANDOM: AMPHETAMINES: NEGATIVE % — ABNORMAL LOW (ref 24–44)

## 2021-08-23 LAB — PHENCYCLIDINES-URINE RANDOM: PHENCYCLIDINE (PCP): NEGATIVE K/UL (ref 0–0.80)

## 2021-08-23 MED ADMIN — LINEZOLID IN DEXTROSE 5% 600 MG/300 ML IV PGBK [86283]: 600 mg | INTRAVENOUS | @ 07:00:00 | Stop: 2021-08-23 | NDC 55150024251

## 2021-08-23 MED ADMIN — CEFTRIAXONE INJ 1GM IVP [210253]: 1 g | INTRAVENOUS | @ 07:00:00 | Stop: 2021-08-23 | NDC 25021010610

## 2021-08-23 MED ADMIN — RIVAROXABAN 20 MG PO TAB [309661]: 20 mg | ORAL | @ 14:00:00 | NDC 50458057901

## 2021-08-24 ENCOUNTER — Encounter: Admit: 2021-08-24 | Discharge: 2021-08-24 | Payer: MEDICARE | Primary: Family

## 2021-08-24 ENCOUNTER — Inpatient Hospital Stay: Admit: 2021-08-24 | Discharge: 2021-08-24 | Payer: MEDICARE | Primary: Family

## 2021-08-24 MED ADMIN — CEFTRIAXONE INJ 1GM IVP [210253]: 2 g | INTRAVENOUS | @ 07:00:00 | NDC 25021010610

## 2021-08-24 MED ADMIN — RIVAROXABAN 20 MG PO TAB [309661]: 20 mg | ORAL | @ 14:00:00 | NDC 50458057901

## 2021-08-24 MED ADMIN — PERFLUTREN LIPID MICROSPHERES 1.1 MG/ML IV SUSP [79178]: 3 mL | INTRAVENOUS | @ 17:00:00 | Stop: 2021-08-24 | NDC 11994001116

## 2021-08-25 ENCOUNTER — Encounter: Admit: 2021-08-25 | Discharge: 2021-08-25 | Payer: MEDICARE | Primary: Family

## 2021-08-25 MED ADMIN — RIVAROXABAN 20 MG PO TAB [309661]: 20 mg | ORAL | @ 13:00:00 | Stop: 2021-08-25 | NDC 50458057901

## 2021-08-25 MED ADMIN — CEFTRIAXONE INJ 1GM IVP [210253]: 2 g | INTRAVENOUS | @ 07:00:00 | Stop: 2021-08-25 | NDC 25021010610

## 2021-08-25 MED FILL — NALOXONE 4 MG/ACTUATION NA SPRY: 4 mg/actuation | 1 days supply | Qty: 2 | Fill #1 | Status: CP

## 2021-08-25 MED FILL — CEPHALEXIN 500 MG PO CAP: 500 mg | ORAL | 4 days supply | Qty: 16 | Fill #1 | Status: CP

## 2021-08-26 ENCOUNTER — Encounter: Admit: 2021-08-26 | Discharge: 2021-08-26 | Payer: MEDICARE | Primary: Family

## 2021-08-26 NOTE — Telephone Encounter
-----   Message from Hester Mates, MD sent at 08/24/2021  5:10 PM CDT -----  Allen Mulders: You will need to use your judgment.  He is not scheduled to see me until October 2023.  His ejection fraction on today's echo appears similar to the prior echocardiogram obtained in November 2022.  If he is not having chest discomfort, then he may be stable for further cardiac evaluation electively.  If not, then please have him schedule with another cardiovascular provider earlier to assess his cardiovascular stability.  You will need to assess the urgency based upon his recent hospitalization.  Thanks.  SBG  ----- Message -----  From: Imagene Sheller, MD  Sent: 08/24/2021   4:09 PM CDT  To: Marzetta Merino, MD; Hester Mates, MD    Hello Dr. Arna Medici!    I am caring for Mr. Allen Lloyd in the hospital due to cellulitis. We are sending him home tomorrow on oral antibiotics. We did an echo and found new lateral wall motion abnormalities. After shared decision making, [name] elected to do a stress test as an outpatient. I wanted to check with you to see if this can be arranged in [place] or if we need to order it to be done in [facility].    Thank you!    - Allen Lloyd

## 2021-08-26 NOTE — Telephone Encounter
Patient denies chest pain will defer testing to appt with [facility] will call if issues sooner.

## 2021-10-08 IMAGING — US ADUPLERABI
1 series · 13 of 25 positions shown · non-contrast
Comparison: none

[Series 1: us arterial (person_name) right leg w abi · arterial · 13 of 35 slices shown]
[im 1/35]
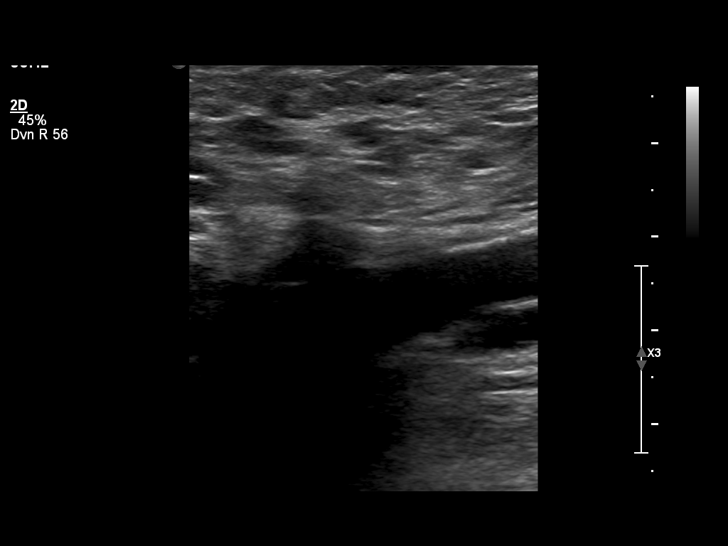
[im 3/35]
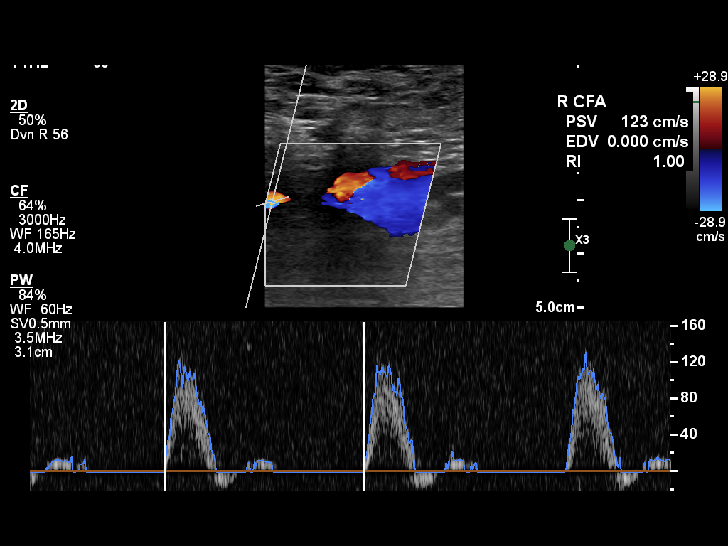
[im 6/35]
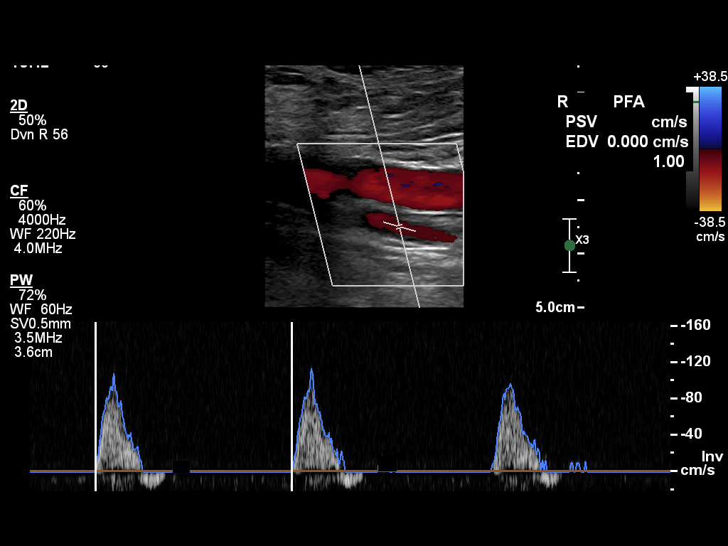
[im 9/35]
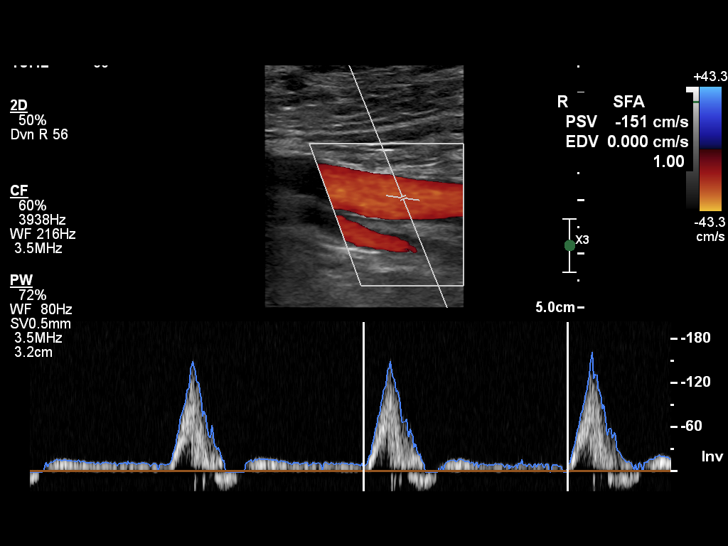
[im 12/35]
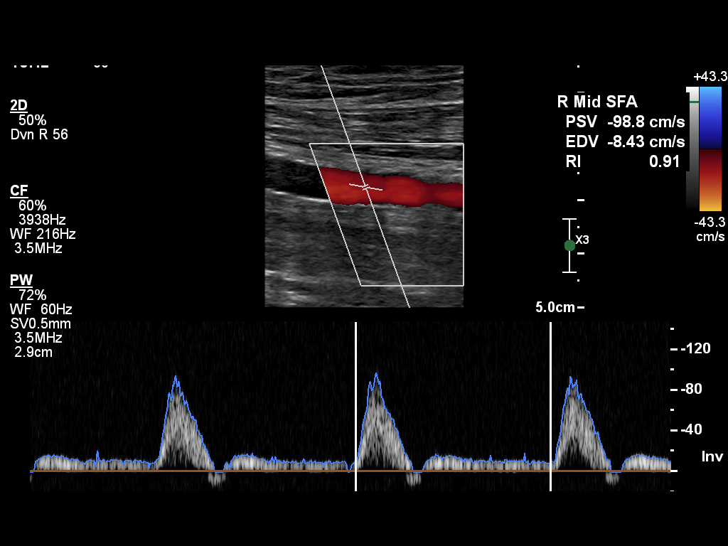
[im 15/35]
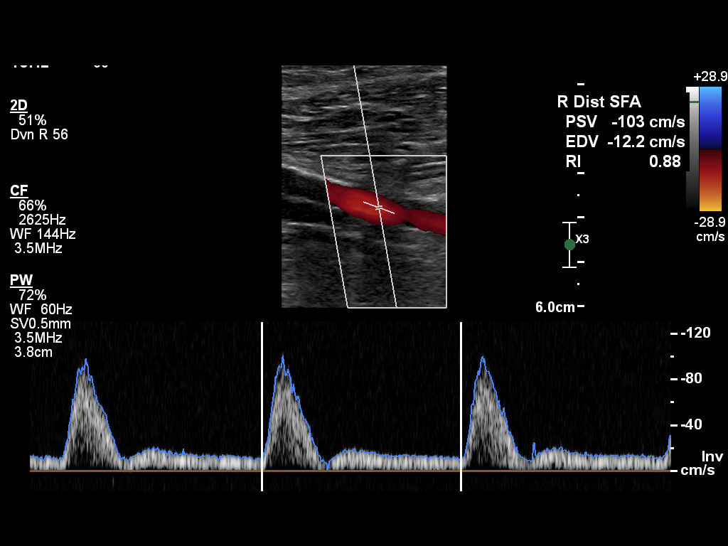
[im 18/35]
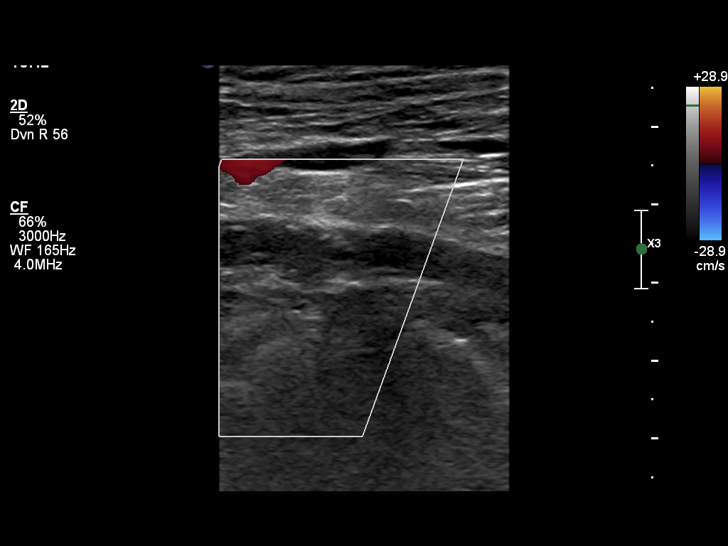
[im 20/35]
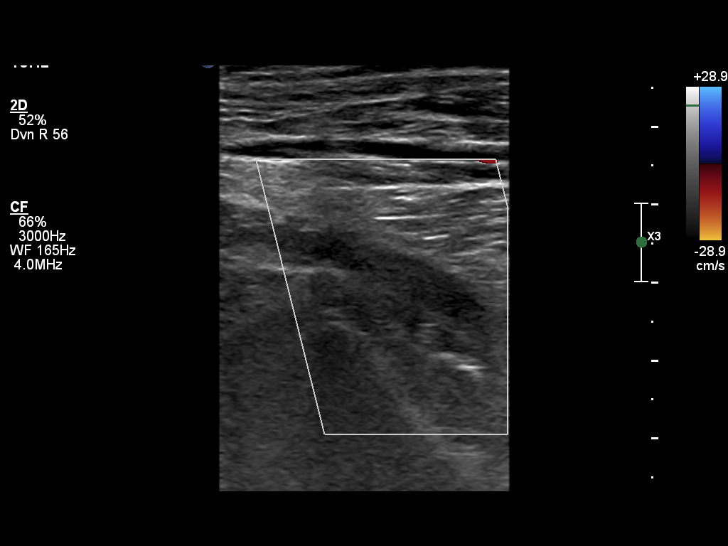
[im 23/35]
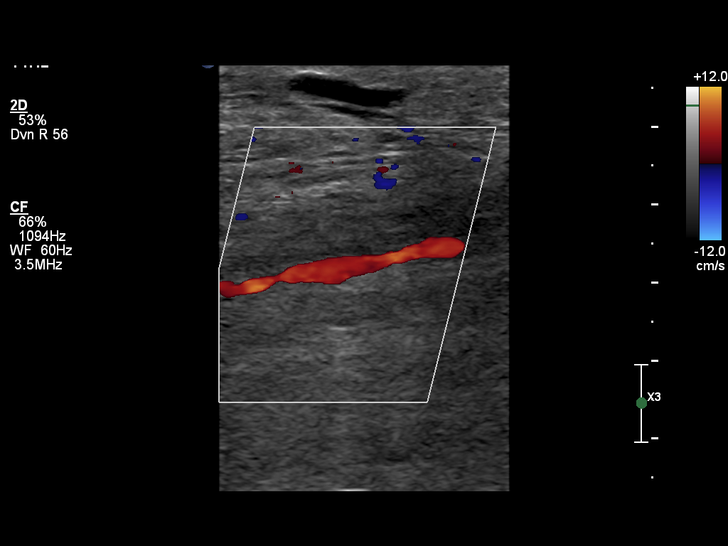
[im 26/35]
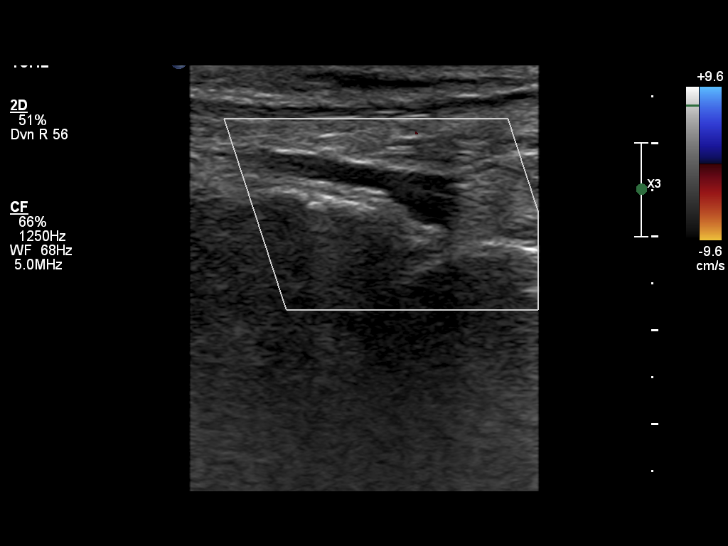
[im 29/35]
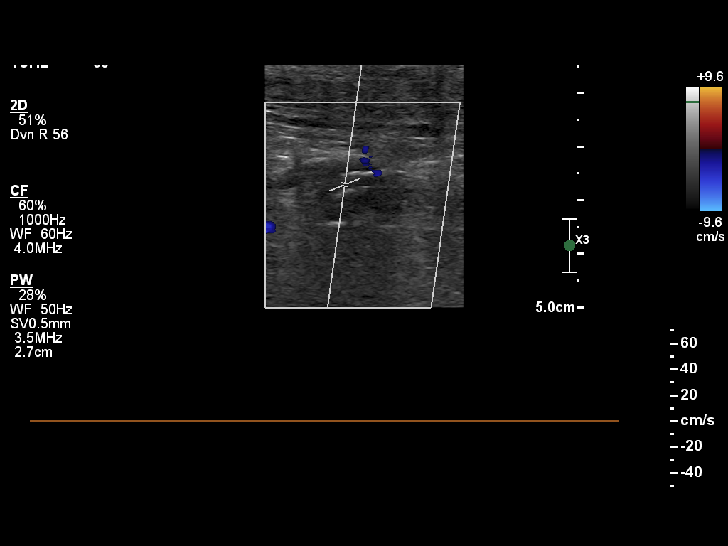
[im 32/35]
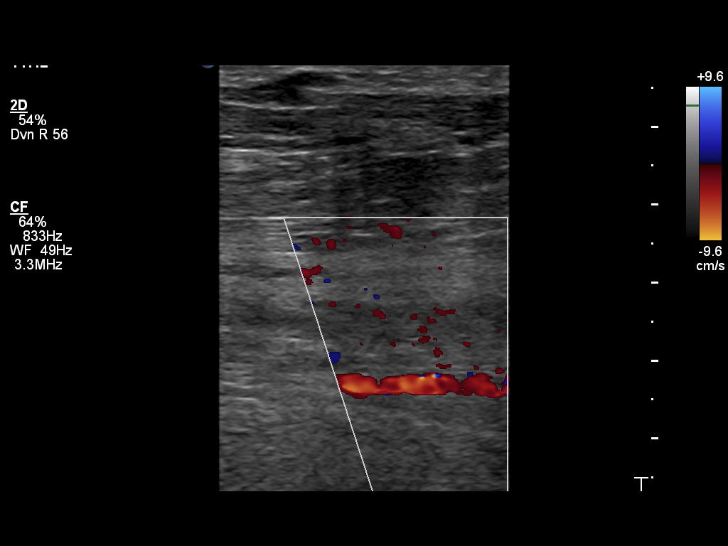
[im 35/35]
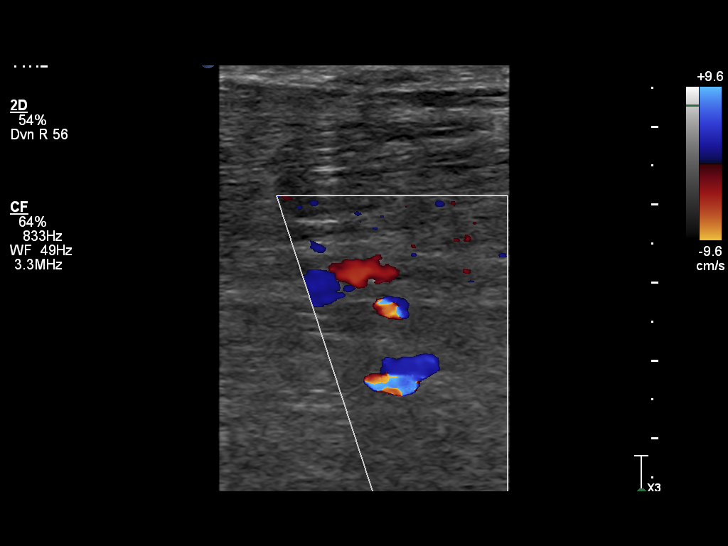

[13 of 25 positions shown; findings below may reference images not displayed]

DIAGNOSTIC STUDIES

EXAM

PERIPHERAL ARTERIAL DUPLEX

INDICATION

intermittent claudication rt leg; below the rt knee the leg is purplish in color; non healing opened
wound to rt lower leg around ankle; hx diabetes;
pt states  he has stent but cant remember where; no stent identified during scan.(..)

TECHNIQUE

Gray scale, color flow and pulsed doppler evaluation of the right lower extremity is performed using
2D grayscale, color flow, spectral pulse Doppler technique. Evaluation of the left lower extremity
is not performed/ordered. ABIs are obtained.

COMPARISONS

None

FINDINGS

RIGHT LOWER EXTREMITY: ABI:  Unobtainable
Common femoral artery: 123 cm/sec; triphasic
Proximal SFA: 151 cm/sec; triphasic Mid SFA: 98.8 cm/sec; triphasic Distal SFA: 103 cm/sec;
triphasic
Proximal popliteal: 218 cm/sec; monophasic
Distal popliteal: No flow
Proximal PTA: Not visualized
Mid PTA: 33.5 cm/sec; monophasic
Distal PTA: Not visualizedProximal peroneal: 46.1 cm/sec; monophasic
Distal peroneal: Not visualized
Proximal anterior tibial: No flow Dorsalis pedis: No flow

Popliteal artery is occluded at its mid and distal level. Complete occlusion of the anterior tibial
artery through the level the dorsalis pedis is present. Posterior tibial artery is not visualized
adenopathy is evaluation. Monophasic flow is demonstrated in the mid and distal posterior tibial
arteries and the proximal peroneal artery compatible with post occlusive changes. Distal peroneal
artery is occluded.

LEFT LOWER EXTREMITY: Not evaluated/ordered

IMPRESSION

Occlusion of the right popliteal artery starting at its mid level extending distally with occlusion
of the anterior tibial artery as above. Distal peroneal artery occlusion.

Tech Notes:

intermittent claudication rt leg; below the rt knee the leg is purplish in color; non healing opened
wound to rt lower leg around ankle; hx diabetes; pt states  he has stent but cant remember where; no
stent identified during scan.

## 2021-11-22 ENCOUNTER — Encounter: Admit: 2021-11-22 | Discharge: 2021-11-22 | Payer: MEDICARE | Primary: Family

## 2021-11-22 MED ORDER — ATORVASTATIN 80 MG PO TAB
80 mg | ORAL_TABLET | Freq: Every day | ORAL | 3 refills
Start: 2021-11-22 — End: ?

## 2021-11-22 MED ORDER — EZETIMIBE 10 MG PO TAB
10 mg | ORAL_TABLET | Freq: Every day | ORAL | 3 refills
Start: 2021-11-22 — End: ?

## 2021-12-21 ENCOUNTER — Encounter: Admit: 2021-12-21 | Discharge: 2021-12-21 | Payer: MEDICARE | Primary: Family

## 2021-12-21 MED ORDER — FUROSEMIDE 40 MG PO TAB
40 mg | ORAL_TABLET | Freq: Every day | ORAL | 3 refills | 90.00000 days | Status: AC | PRN
Start: 2021-12-21 — End: ?

## 2021-12-21 MED ORDER — EPLERENONE 25 MG PO TAB
25 mg | ORAL_TABLET | Freq: Every day | ORAL | 3 refills | 90.00000 days | Status: AC
Start: 2021-12-21 — End: ?

## 2021-12-24 ENCOUNTER — Encounter: Admit: 2021-12-24 | Discharge: 2021-12-24 | Payer: MEDICARE | Primary: Family

## 2021-12-24 MED ORDER — CARVEDILOL 12.5 MG PO TAB
12.5 mg | ORAL_TABLET | Freq: Two times a day (BID) | ORAL | 1 refills | 90.00000 days | Status: AC
Start: 2021-12-24 — End: ?

## 2022-01-04 ENCOUNTER — Encounter: Admit: 2022-01-04 | Discharge: 2022-01-04 | Payer: MEDICARE | Primary: Family

## 2022-01-05 ENCOUNTER — Encounter: Admit: 2022-01-05 | Discharge: 2022-01-05 | Payer: MEDICARE

## 2022-01-05 DIAGNOSIS — I739 Peripheral vascular disease, unspecified: Secondary | ICD-10-CM

## 2022-01-05 DIAGNOSIS — E785 Hyperlipidemia, unspecified: Secondary | ICD-10-CM

## 2022-01-05 DIAGNOSIS — I10 Essential (primary) hypertension: Secondary | ICD-10-CM

## 2022-01-05 DIAGNOSIS — I2510 Atherosclerotic heart disease of native coronary artery without angina pectoris: Secondary | ICD-10-CM

## 2022-01-05 DIAGNOSIS — E119 Type 2 diabetes mellitus without complications: Secondary | ICD-10-CM

## 2022-01-05 DIAGNOSIS — Z86718 Personal history of other venous thrombosis and embolism: Secondary | ICD-10-CM

## 2022-01-05 DIAGNOSIS — I429 Cardiomyopathy, unspecified: Secondary | ICD-10-CM

## 2022-01-05 DIAGNOSIS — I5020 Unspecified systolic (congestive) heart failure: Secondary | ICD-10-CM

## 2022-01-05 MED ORDER — ASPIRIN 81 MG PO TBEC
81 mg | ORAL_TABLET | Freq: Every day | ORAL | 1 refills | Status: AC
Start: 2022-01-05 — End: ?
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
## 2022-01-05 NOTE — Patient Instructions
Thank you for visiting our office today.    We would like to make the following medication adjustments:      Stop Plavix  Start Aspirin 81mg       Otherwise continue the same medications as you have been doing.          We will be pursuing the following tests after your appointment today:       Orders Placed This Encounter    AMB REFERRAL TO VASCULAR SURGERY    AMB REFERRAL TO PULMONARY    aspirin EC (ASPIR-LOW) 81 mg tablet         We will plan to see you back in 6 months.  Please call us in the meantime with any questions or concerns.        Please allow 5-7 business days for our providers to review your results. All normal results will go to MyChart. If you do not have Mychart, it is strongly recommended to get this so you can easily view all your results. If you do not have mychart, we will attempt to call you once with normal lab and testing results. If we cannot reach you by phone with normal results, we will send you a letter.  If you have not heard the results of your testing after one week please give [place] a call.       Your Cardiovascular Medicine [place] Team Richardson Landry, Rene Kocher, Threasa Beards, and [place])  phone number is [phone].

## 2022-01-06 ENCOUNTER — Encounter: Admit: 2022-01-06 | Discharge: 2022-01-06 | Payer: MEDICARE

## 2022-01-21 ENCOUNTER — Encounter: Admit: 2022-01-21 | Discharge: 2022-01-21 | Payer: MEDICARE

## 2022-01-21 MED ORDER — XARELTO 20 MG PO TAB
20 mg | ORAL_TABLET | Freq: Every day | ORAL | 3 refills | 30.00000 days | Status: AC
Start: 2022-01-21 — End: ?

## 2022-02-11 ENCOUNTER — Encounter: Admit: 2022-02-11 | Discharge: 2022-02-11 | Payer: MEDICARE

## 2022-02-11 DIAGNOSIS — I739 Peripheral vascular disease, unspecified: Secondary | ICD-10-CM

## 2022-02-11 NOTE — Progress Notes
STAT    Request for the following medical records from [facility] Beaulah Dinning, MD  for purpose of continuity of care. Patient has an upcoming cardiology appointment.     Patient: Allen Lloyd  DOB: March 10, 1956    Please Fax:    Most recent testing reports related to lower extremities  Most recent office visit notes (past 2)      Please fax to: [phone]  Cardiology Services at the [facility]  Attention: Medical Record/Dr Neelati    Thank you

## 2022-02-18 ENCOUNTER — Encounter: Admit: 2022-02-18 | Discharge: 2022-02-18 | Payer: MEDICARE

## 2022-02-18 ENCOUNTER — Ambulatory Visit: Admit: 2022-02-18 | Discharge: 2022-02-19 | Payer: MEDICARE

## 2022-02-18 DIAGNOSIS — Z86718 Personal history of other venous thrombosis and embolism: Secondary | ICD-10-CM

## 2022-02-18 DIAGNOSIS — I5020 Unspecified systolic (congestive) heart failure: Secondary | ICD-10-CM

## 2022-02-18 DIAGNOSIS — R609 Edema, unspecified: Secondary | ICD-10-CM

## 2022-02-18 DIAGNOSIS — I739 Peripheral vascular disease, unspecified: Secondary | ICD-10-CM

## 2022-02-18 DIAGNOSIS — M79606 Pain in leg, unspecified: Secondary | ICD-10-CM

## 2022-02-18 DIAGNOSIS — E785 Hyperlipidemia, unspecified: Secondary | ICD-10-CM

## 2022-02-18 DIAGNOSIS — E119 Type 2 diabetes mellitus without complications: Secondary | ICD-10-CM

## 2022-02-18 DIAGNOSIS — I10 Essential (primary) hypertension: Secondary | ICD-10-CM

## 2022-02-18 DIAGNOSIS — I2510 Atherosclerotic heart disease of native coronary artery without angina pectoris: Secondary | ICD-10-CM

## 2022-02-18 NOTE — Patient Instructions
Follow-Up:    -Thank you for allowing [place] to participate in your care today. Your After Visit Summary is being completed by Brigitte Pulse, RN.    -We would like you to follow up in  3 months with Woody Seller, MD  -The schedule is released approximately 4-5 months in advance. You will be called by our scheduling department to make an appointment and you will also receive a notification via MyChart to self-schedule.  However, if you would like to call to make this appointment, please call [phone].    -Please schedule the following testing at check out, or by calling our scheduling line: Venous scan and arterial scan     -Dr. Trixie Dredge would like you to wear compression stockings     Contacting our office:    -For NON-URGENT questions please contact us via message through your MyChart account.   -For all medication refills please contact your pharmacy or send a request through MyChart.     -For all questions that may need to be addressed urgently please call the [facility] nursing triage line at [phone] Monday - Friday 8am-5pm only. Please leave a detailed message with your name, date of birth, and reason for your call.  If your message is received before 3:30pm, every effort will be made to call you back the same day.  Please allow time for [place] to review your chart prior to call back.     -Should you have an urgent concern over the weekend/nights, the on-call triage line is [phone].    [facility] nursing team fax number: [phone]    -You may receive a survey in the upcoming weeks from [facility]. Your feedback is important to [place] and helps [place] continue to improve patient care and patient satisfaction.     -Please feel free to call our Financial Department at [phone] with any questions or concerns about estimated cost of testing or imaging ordered today. We are happy to provide CPT codes upon request.    Results & Testing Follow Up:    -Please allow 5-7 business days for the results of any testing to be reviewed. Please call our office if you have not heard from a nurse within this time frame.    -Should you choose to complete testing at an outside facility, please contact our office after completion of testing so that we can ensure that we have received results for your provider to review.    Lab and test results:  As a part of the CARES act, starting 06/14/2019, some results will be released to you via MyChart immediately and automatically.  You may see results before your provider sees them; however, your provider will review all these results and then they, or one of their team, will notify you of result information and recommendations.   Critical results will be addressed immediately, but otherwise, please allow [place] time to get back with you prior to you reaching out to [place] for questions.  This will usually take about 72 hours for labs and 5-7 days for procedure test results.

## 2022-02-19 DIAGNOSIS — I10 Essential (primary) hypertension: Secondary | ICD-10-CM

## 2022-02-19 DIAGNOSIS — I2510 Atherosclerotic heart disease of native coronary artery without angina pectoris: Secondary | ICD-10-CM

## 2022-02-19 DIAGNOSIS — Z86718 Personal history of other venous thrombosis and embolism: Secondary | ICD-10-CM

## 2022-02-19 DIAGNOSIS — E785 Hyperlipidemia, unspecified: Secondary | ICD-10-CM

## 2022-02-19 DIAGNOSIS — I429 Cardiomyopathy, unspecified: Secondary | ICD-10-CM

## 2022-02-25 ENCOUNTER — Encounter: Admit: 2022-02-25 | Discharge: 2022-02-25 | Payer: MEDICARE

## 2022-02-25 MED ORDER — CLOPIDOGREL 75 MG PO TAB
75 mg | ORAL_TABLET | Freq: Every day | ORAL | 3 refills
Start: 2022-02-25 — End: ?

## 2022-03-24 ENCOUNTER — Encounter: Admit: 2022-03-24 | Discharge: 2022-03-24 | Payer: MEDICARE

## 2022-03-24 NOTE — Telephone Encounter
This message is regarding your cardiology appointment 03/26/22 at the [facility]. Please follow the following instructions:     Drink (3)-8 Oz of water 2 hours prior.  No Caffeine the evening before or the morning of.   Remove Compression stockings 48 hours prior to scan.

## 2022-03-26 ENCOUNTER — Encounter: Admit: 2022-03-26 | Discharge: 2022-03-26 | Payer: MEDICARE

## 2022-03-26 ENCOUNTER — Ambulatory Visit: Admit: 2022-03-26 | Discharge: 2022-03-26 | Payer: MEDICARE

## 2022-03-26 DIAGNOSIS — M79606 Pain in leg, unspecified: Secondary | ICD-10-CM

## 2022-03-26 DIAGNOSIS — Z86718 Personal history of other venous thrombosis and embolism: Secondary | ICD-10-CM

## 2022-03-26 DIAGNOSIS — R609 Edema, unspecified: Secondary | ICD-10-CM

## 2022-03-26 DIAGNOSIS — I739 Peripheral vascular disease, unspecified: Secondary | ICD-10-CM

## 2022-04-01 ENCOUNTER — Encounter: Admit: 2022-04-01 | Discharge: 2022-04-01 | Payer: MEDICARE

## 2022-04-01 DIAGNOSIS — I739 Peripheral vascular disease, unspecified: Secondary | ICD-10-CM

## 2022-04-20 ENCOUNTER — Encounter: Admit: 2022-04-20 | Discharge: 2022-04-20 | Payer: MEDICARE

## 2022-04-20 NOTE — Telephone Encounter
Pt left VM requesting call back to discuss ABI and venous US results.     Returned call to pt. Pt states he is too busy in the summer months to do SET. He is agreeable to home walking program. Advised pt I will send information via MyChart. Pt verbalizes understanding of recommendations/orders. All questions addressed.

## 2022-05-07 ENCOUNTER — Encounter: Admit: 2022-05-07 | Discharge: 2022-05-07 | Payer: MEDICARE

## 2022-05-23 ENCOUNTER — Encounter: Admit: 2022-05-23 | Discharge: 2022-05-23 | Payer: MEDICARE

## 2022-05-23 MED ORDER — CLOPIDOGREL 75 MG PO TAB
75 mg | ORAL_TABLET | Freq: Every day | ORAL | 3 refills
Start: 2022-05-23 — End: ?

## 2022-05-27 ENCOUNTER — Encounter: Admit: 2022-05-27 | Discharge: 2022-05-27 | Payer: MEDICARE

## 2022-05-27 ENCOUNTER — Ambulatory Visit: Admit: 2022-05-27 | Discharge: 2022-05-28 | Payer: MEDICARE

## 2022-05-27 DIAGNOSIS — I10 Essential (primary) hypertension: Secondary | ICD-10-CM

## 2022-05-27 DIAGNOSIS — I5020 Unspecified systolic (congestive) heart failure: Secondary | ICD-10-CM

## 2022-05-27 DIAGNOSIS — M79606 Pain in leg, unspecified: Secondary | ICD-10-CM

## 2022-05-27 DIAGNOSIS — E785 Hyperlipidemia, unspecified: Secondary | ICD-10-CM

## 2022-05-27 DIAGNOSIS — E119 Type 2 diabetes mellitus without complications: Secondary | ICD-10-CM

## 2022-05-27 DIAGNOSIS — I739 Peripheral vascular disease, unspecified: Secondary | ICD-10-CM

## 2022-05-27 DIAGNOSIS — Z86718 Personal history of other venous thrombosis and embolism: Secondary | ICD-10-CM

## 2022-05-27 DIAGNOSIS — I720 Aneurysm of carotid artery: Secondary | ICD-10-CM

## 2022-05-27 DIAGNOSIS — I2510 Atherosclerotic heart disease of native coronary artery without angina pectoris: Secondary | ICD-10-CM

## 2022-05-27 NOTE — Progress Notes
Date of Service: 05/27/2022    Allen Lloyd is a 67 y.o. male.       HPI     Allen Lloyd is here today for follow up of bilateral lower extremity symptoms.    He has a history of peripheral arterial disease and recently has been following with the vascular surgery team at [facility].  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  He was hesitant to proceed with surgery and here today for second opinion.  He currently denies symptoms of claudication or rest pain.  He has developed ulcerations on the anterior leg in the past secondary to injuries that have typically healed within a few weeks.  He currently denies any ulcerations.  He denies any limitation to activity secondary to known peripheral arterial disease.    He also has a history of ischemic cardiac disease and underwent CABG x 4 in 2013.  The left leg great saphenous vein was used as a conduit.  Since then he has been noticing progressively worsening skin changes including swelling, dryness, hyperpigmentation.  He is also noticed varicose veins particularly at the the right medial thigh but currently not bothersome.  He reports he was also diagnosed with a DVT back in 2013 and has been on anticoagulation with Xarelto 20 mg since.  He currently intermittently wears compression stockings but overall finds them bothersome to wear them consistently.    He follows with Dr. Arna Medici with [facility] cardiology for history of cardiac disease.     Denies any change since our last visit.  He remains active and is looking forward to summer as he enjoys fishing and spending the majority of his time outside.  He continues to deny symptoms of claudication or rest pain.  He did develop a small ulcer on the anterior leg after running into a table last night.         Vitals:    05/27/22 1106 05/27/22 1108   BP: 119/68 103/66   BP Source: Arm, Right Upper Arm, Left Upper   Pulse: 55 55   Temp: 36.6 ?C (97.9 ?F) TempSrc: Oral    PainSc: Zero    Weight: 102.4 kg (225 lb 12.8 oz)    Height: 182.9 cm (6')        Body mass index is 30.62 kg/m?[name]     Past Medical History  Patient Active Problem List    Diagnosis Date Noted    Cellulitis 08/25/2021    Cardiomyopathy, unspecified type (HCC) 06/23/2021    Chronic anticoagulation on Xarelto 12/10/2020    Unstable angina (HCC) 12/09/2020    Edema 12/08/2020    Essential hypertension 12/08/2020    Low platelet count (HCC) 12/08/2020    Type 2 diabetes mellitus (HCC) 12/08/2020    CAD (coronary artery disease) 12/08/2020     04/16/2003 CABG x 4 LIMA to LAD, SVG to PDA, SVG to Diag, SVG to OM  01/16/2012 Heart Cath PCT/Stent placement x 3 RCA 3.5X12 (2), 3.5x22    09/20/2012 Heart Cath: 27fr femoral access Surgical consult    09/21/2012 Cutting balloon and stent x 3 of RCA graft     02/15/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  LAD Mid-vessel lesion 100%. Mid-vessel Lesion:  75% stenosis.  Distal vessel lesion: 75% stenosis.  Lt Circ:  Mid-vessel lesion: 100%.  Right coronary Distal vessel lesion 80% in-stent recurrent stenosis, at the sited of prior intervention #2. Right posterior descending: Proximal vessel lesion:  85%in-stent recurrent stenosis.  RCA posterolateral extension: Lesion 80% in-stent recurrent stenosis.  SVG to right coronary, from the aorta; Proximal anastomosis lesion. There is a 100% stenosis.  SVG to 1st OM from aorta:  Proximal anastomosis lesion 100% stenosis.  SVG to 1st Diagonal from the aortal:  Proximal anastomosis lesion:  100% stenosis.  Refer to Dr. Trinidad Curet for PCI if possible.       Hyperlipemia 12/08/2020    PVD (peripheral vascular disease) (HCC) 12/08/2020     09/23/2017 PV Venous Lower Extremity Right ([facility]): Findings suggesting thrombus in the femoral and popliteal veins.   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    04/09/2015 MR Angiography Lower Extremities ([facility]):  There are three renal arteries on the left.  There is a single renal artery on the right.  There is no significant abnormality of the abdominal aorta, common iliac arteries or external iliac arteries bilaterally. There is a moderate to high grade short segmental stenosis of the right common femoral artery with a stenosis of approximately 70%. The right superficial femoral and popliteal arteries are patent. There is limited visualization of the arteries below the knee due to venous contamination and superimposed venous structures. There appears to be a patent right posterior tibial artery to the ankle, although there are multiple areas of moderate stenosis proximally. There appears to be occlusion with reconstitution distally of the anterior tibial and peroneal arteries on the right. On the left there is an occluded anterior tibial artery proximally. There is a patent left posterior tibial artery with a moderate stenosis proximally. The left peroneal artery is patent proximally and appears to be occluded distally.        History of DVT (deep vein thrombosis) 12/08/2020         ROS    Physical Exam   Vitals reviewed.  Abdominal: Normal appearance.   Musculoskeletal:         General: No tenderness.      Right lower leg: Edema present.      Comments: Right leg: Significant hyperpigmentation of the leg with small skin breakdown on the anterior leg no ulceration or drainage from this skin breakdown.  1+ pitting edema    Left leg: Significant hyperpigmentation of the distal leg, no skin breakdown or ulceration, no significant pitting edema   Neurological: He is alert.   Skin: Skin is warm and dry. Cardiovascular Studies      Cardiovascular Health Factors  Vitals BP Readings from Last 3 Encounters:   05/27/22 103/66   03/26/22 114/62   03/26/22 114/62     Wt Readings from Last 3 Encounters:   05/27/22 102.4 kg (225 lb 12.8 oz)   03/26/22 103 kg (227 lb)   03/26/22 103 kg (227 lb)     BMI Readings from Last 3 Encounters:   05/27/22 30.62 kg/m?   03/26/22 30.79 kg/m?   03/26/22 30.79 kg/m?      Smoking Social History     Tobacco Use   Smoking Status Former  Current packs/day: 0.00    Types: Cigarettes    Quit date: 03/15/1968    Years since quitting: 54.2   Smokeless Tobacco Never      Lipid Profile Cholesterol   Date Value Ref Range Status   08/23/2021 76 <200 MG/DL Final     HDL   Date Value Ref Range Status   08/23/2021 31 (L) >40 MG/DL Final     LDL   Date Value Ref Range Status   08/23/2021 27 <100 mg/dL Final     Triglycerides   Date Value Ref Range Status   08/23/2021 144 <150 MG/DL Final      Blood Sugar Hemoglobin A1C   Date Value Ref Range Status   11/19/2021 8.1 (H) 4.5 - 6.5 Final     Glucose   Date Value Ref Range Status   12/08/2021 199 (H) 70 - 105 Final   08/25/2021 128 (H) 70 - 100 MG/DL Final   16/12/9602 540 (H) 70 - 100 MG/DL Final   98/01/9146 829 (H) 65 - 99 Final     Glucose, POC   Date Value Ref Range Status   08/25/2021 154 (H) 70 - 100 MG/DL Final   56/21/3086 578 (H) 70 - 100 MG/DL Final   46/96/2952 841 (H) 70 - 100 MG/DL Final          Problems Addressed Today  Encounter Diagnoses   Name Primary?    PVD (peripheral vascular disease) (HCC)     Pain of lower extremity, unspecified laterality     Edema, unspecified type     History of DVT (deep vein thrombosis)     Aneurysm of carotid artery (HCC)        Assessment and Plan     Mr. Cameron is here today for follow up of peripheral arterial disease.  He previously followed at [facility] for management and has a known right popliteal artery occlusion.  He completed a arterial duplex shows mildly decreased right ABI with severe stenosis of the popliteal artery and monophasic waveforms distal to the occlusion.  On the left side ABI is normal with moderate stenosis of the profunda femoris and tibioperoneal trunk.  He currently denies any symptoms of claudication, rest pain, or nonhealing ulceration.  Discussed referral to supervised exercise therapy but he would prefer to try the home walking program.    We also checked a venous insufficiency ultrasound which shows old blood clot in the right femoral and popliteal veins.  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    He follows with Dr. Arna Medici for cardiac care.  He is on appropriate therapy for atherosclerotic risk factor optimization.    Check carotid duplex    Follow up in 6 months with repeat ABI and carotid duplex           Current Medications (including today's revisions)   amLODIPine (NORVASC) 10 mg tablet Take one tablet by mouth daily. Hold until follow up with PCP or cardiology    aspirin EC (ASPIR-LOW) 81 mg tablet Take one tablet by mouth daily.    atorvastatin (LIPITOR) 80 mg tablet TAKE 1 TABLET BY MOUTH DAILY    carvediloL (COREG) 12.5 mg tablet Take one tablet by mouth twice daily. Take with food.    dapagliflozin (FARXIGA) 10 mg tablet Take one tablet by mouth daily. 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    ezetimibe (ZETIA) 10 mg tablet TAKE 1 TABLET BY MOUTH DAILY    gabapentin (NEURONTIN) 400 mg capsule Take one capsule by mouth three times daily.    HYDROcodone/acetaminophen (NORCO) 10/325 mg tablet Take one tablet by mouth every 6 hours as needed.    insulin detemir U-100(+) (LEVEMIR) 100 unit/mL vial Inject twenty Units to thirty two Units under the skin daily. Sliding scale    lisinopriL (ZESTRIL) 2.5 mg tablet Take one tablet by mouth daily. Hold until follow up with PCP or cardiology    metFORMIN (GLUCOPHAGE) 1,000 mg tablet Take one tablet by mouth daily.    nitroglycerin (NITROSTAT) 0.4 mg tablet Place one tablet under tongue every 5 minutes as needed for Chest Pain. Max of 3 tablets, call 911.    TRADJENTA 5 mg tablet Take one tablet by mouth daily.    XARELTO 20 mg tablet TAKE 1 TABLET BY MOUTH DAILY

## 2022-05-27 NOTE — Patient Instructions
Follow-Up:    -Thank you for allowing [place] to participate in your care today. Your After Visit Summary is being completed by Eunice Blase, RN.    -We would like you to follow up in  6 months with Woody Seller, MD  -The schedule is released approximately 4-5 months in advance. You will be called by our scheduling department to make an appointment and you will also receive a notification via MyChart to self-schedule.  However, if you would like to call to make this appointment, please call [phone].    -Please schedule the following testing at check out, or by calling our scheduling line: ABI's-Rest/Carotid Artery Duplex Ultrasound.  Testing is to be scheduled same day as office visit with Dr Trixie Dredge in 6 months.  Testing prior to office visit.    Changes From Today's Office Visit  Orders placed for ABI's at rest to be done in 6 months.  Same day as office visit with Dr Trixie Dredge  Orders placed for Carotid Artery Duplex ultrasound to be done in 6 months.  Same day as office visit with Dr Trixie Dredge  Follow up with Dr Trixie Dredge in 6 months after above testing.  Appointment and testing that was scheduled in July has been canceled per Dr Trixie Dredge.  She will see you in 6 months instead.    Contacting our office:    -For NON-URGENT questions please contact us via message through your MyChart account.   -For all medication refills please contact your pharmacy or send a request through MyChart.     -For all questions that may need to be addressed urgently please call the [facility] nursing triage line at [phone] Monday - Friday 8am-5pm only. Please leave a detailed message with your name, date of birth, and reason for your call.  If your message is received before 3:30pm, every effort will be made to call you back the same day.  Please allow time for [place] to review your chart prior to call back.     -Should you have an urgent concern over the weekend/nights, the on-call triage line is [phone].    [facility] nursing team fax number: [phone]    -You may receive a survey in the upcoming weeks from [facility] [place] [facility]. Your feedback is important to [place] and helps [place] continue to improve patient care and patient satisfaction.     -Please feel free to call our Financial Department at [phone] with any questions or concerns about estimated cost of testing or imaging ordered today. We are happy to provide CPT codes upon request.    Results & Testing Follow Up:    -Please allow 5-7 business days for the results of any testing to be reviewed. Please call our office if you have not heard from a nurse within this time frame.    -Should you choose to complete testing at an outside facility, please contact our office after completion of testing so that we can ensure that we have received results for your provider to review.    Lab and test results:  As a part of the CARES act, starting 06/14/2019, some results will be released to you via MyChart immediately and automatically.  You may see results before your provider sees them; however, your provider will review all these results and then they, or one of their team, will notify you of result information and recommendations.   Critical results will be addressed immediately, but otherwise, please allow [place] time to get back with you  prior to you reaching out to [place] for questions.  This will usually take about 72 hours for labs and 5-7 days for procedure test results.

## 2022-05-28 DIAGNOSIS — R609 Edema, unspecified: Secondary | ICD-10-CM

## 2022-05-31 ENCOUNTER — Encounter: Admit: 2022-05-31 | Discharge: 2022-05-31 | Payer: MEDICARE

## 2022-06-09 ENCOUNTER — Encounter: Admit: 2022-06-09 | Discharge: 2022-06-09 | Payer: MEDICARE

## 2022-06-09 NOTE — Telephone Encounter
Patient called in returning call letting him know he needed to reschedule his appointment, please contact-SM

## 2022-06-09 NOTE — Telephone Encounter
Pt reports he is scheduled for September 19th with Dr. Rollene Fare and someone called him this morning telling him he needing to reschedule appointments in September while he was in [place] and not able to look at calendar. He is calling back to r/s those appt. Advised would send call to scheduling and they would reach back out to him.

## 2022-06-10 ENCOUNTER — Encounter: Admit: 2022-06-10 | Discharge: 2022-06-10 | Payer: MEDICARE

## 2022-06-28 ENCOUNTER — Encounter: Admit: 2022-06-28 | Discharge: 2022-06-28 | Payer: MEDICARE

## 2022-06-28 NOTE — Telephone Encounter
Received a call from Allen Burton, RN with Dr. [name] office  (phone: [phone], fax [phone]) requesting approval to hold Xarelto prior to procedure.  He is scheduled on 4/29 for excision of skin lesion on back and they are requesting approval to hold Xarelto for 48 hours prior to procedure.    Allen Lloyd is on Xarelto for DVT prophylaxis.  His CHADSVASC score is 6 for history of hypertension, heart failure, thromboembolism, vascular disease and diabetes.    Patient was last seen by Dr. [name] in October 2023.  He has also seen Dr. [name] with our vascular department in March 2024.      Called and left VM requesting callback to check on cardiovascular symptoms and discuss risks of holding Xarelto prior to procedure.    Will route to [facility] for review and recommendations.

## 2022-06-29 ENCOUNTER — Encounter: Admit: 2022-06-29 | Discharge: 2022-06-29 | Payer: MEDICARE

## 2022-06-29 NOTE — Telephone Encounter
Hester Mates, MD  Rogelia Boga, RN; Idalia Needle, RN  Caller: Unspecified Burgess Estelle,  2:38 PM)  To all: It would be preferable if Mr. Jowers could have his procedure without interrupting his rivaroxaban.  If interruption of his rivaroxaban is required by his surgical team, then the patient will have to accept the risk involved.  This includes the risk of pulmonary embolism which could be life-threatening.  If his rivaroxaban is held during the procedural period, then I would recommend holding it for as short of a period as possible.  Please document risk discussion with the patient.  Thanks.  SBG     Received a call from Irving Burton, RN with Dr. Isaac Bliss office  (phone: [phone], fax [phone]) requesting approval to hold Xarelto prior to procedure.  He is scheduled on 4/29 for excision of skin lesion on back and they are requesting approval to hold Xarelto for 48 hours prior to procedure.     Zubayr is on Xarelto for DVT prophylaxis.  His CHADSVASC score is 6 for history of hypertension, heart failure, thromboembolism, vascular disease and diabetes.     Patient was last seen by Dr. Arna Medici in October 2023.  He has also seen Dr. Trixie Dredge with our vascular department in March 2024.       Called and left VM requesting callback to check on cardiovascular symptoms and discuss risks of holding Xarelto prior to procedure.     Will route to SBG for review and recommendations.         Discussed with patient.  He was made aware of risk associated with holding medication.

## 2022-07-18 ENCOUNTER — Encounter: Admit: 2022-07-18 | Discharge: 2022-07-18 | Payer: MEDICARE

## 2022-07-18 MED ORDER — CARVEDILOL 12.5 MG PO TAB
12.5 mg | ORAL_TABLET | Freq: Two times a day (BID) | ORAL | 1 refills
Start: 2022-07-18 — End: ?

## 2022-08-16 ENCOUNTER — Encounter: Admit: 2022-08-16 | Discharge: 2022-08-16 | Payer: MEDICARE

## 2022-08-17 ENCOUNTER — Encounter: Admit: 2022-08-17 | Discharge: 2022-08-17 | Payer: MEDICARE

## 2022-08-17 DIAGNOSIS — I739 Peripheral vascular disease, unspecified: Secondary | ICD-10-CM

## 2022-08-17 DIAGNOSIS — I429 Cardiomyopathy, unspecified: Secondary | ICD-10-CM

## 2022-08-17 DIAGNOSIS — Z86718 Personal history of other venous thrombosis and embolism: Secondary | ICD-10-CM

## 2022-08-17 DIAGNOSIS — E119 Type 2 diabetes mellitus without complications: Secondary | ICD-10-CM

## 2022-08-17 DIAGNOSIS — I2510 Atherosclerotic heart disease of native coronary artery without angina pectoris: Secondary | ICD-10-CM

## 2022-08-17 DIAGNOSIS — I10 Essential (primary) hypertension: Secondary | ICD-10-CM

## 2022-08-17 DIAGNOSIS — E785 Hyperlipidemia, unspecified: Secondary | ICD-10-CM

## 2022-08-17 DIAGNOSIS — I110 Hypertensive heart disease with heart failure: Secondary | ICD-10-CM

## 2022-08-17 DIAGNOSIS — Z136 Encounter for screening for cardiovascular disorders: Secondary | ICD-10-CM

## 2022-08-17 DIAGNOSIS — I2089 Other forms of angina pectoris (HCC): Secondary | ICD-10-CM

## 2022-08-17 DIAGNOSIS — I5020 Unspecified systolic (congestive) heart failure: Secondary | ICD-10-CM
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
## 2022-08-17 NOTE — Patient Instructions
Thank you for visiting our office today.    We would like to make the following medication adjustments:  NONE       Otherwise continue the same medications as you have been doing.          We will be pursuing the following tests after your appointment today:       Orders Placed This Encounter    ECG 12-LEAD         We will plan to see you back in 6 months.  Please call us in the meantime with any questions or concerns.        Please allow 5-7 business days for our providers to review your results. All normal results will go to MyChart. If you do not have Mychart, it is strongly recommended to get this so you can easily view all your results. If you do not have mychart, we will attempt to call you once with normal lab and testing results. If we cannot reach you by phone with normal results, we will send you a letter.  If you have not heard the results of your testing after one week please give us a call.       Your Cardiovascular Medicine [place] Team (Steve, Lisa, Jamie, Melanie, and Cristiana Yochim)  phone number is [phone].

## 2022-09-08 ENCOUNTER — Encounter: Admit: 2022-09-08 | Discharge: 2022-09-08 | Payer: MEDICARE

## 2022-09-08 NOTE — Telephone Encounter
Returned call to discuss with patient further. Need to reschedule September appointment to a Tuesday due to transportation issues.    Patient also states he cut is right leg on a spicket on a bucket he was moving a few weeks ago. States that his right leg has been hurting a lot, increasing with activity. Pain starts in calf and goes up leg. States it feels sore and is slightly purple and red. Patient does state he believes a scab is starting to form over one of the wounds.     Patient asked to send picture via MyChart messaging for Dr. Trixie Dredge to review. Will hold off on canceling imaging and rescheduling studies until Dr. Trixie Dredge reviews.     Patient verbalized understanding and no further questions at this time.

## 2022-09-08 NOTE — Telephone Encounter
Who is calling? Allen Lloyd     Is the patient new or return? Return, Dr. Trixie Dredge    What is the reason for the call? Patient called and stated that he is needing to reschedule his upcoming appointment with Dr. Trixie Dredge due to his transportation not being to bring him on a Thursday.  Patient stated that he can only come to the clinic on Tuesdays due to the availability of his transportation.  Please reach back out to the patient to further discuss/assist.  Thank you.       Are there any other needs/concerns the patient has?  yes  If yes, description of concern:  Patient stated that he has open wounds on his right leg for the last month that are not healing.      Patient was notified that if call was received after 3:30 pm the call may not be returned until the next business day. Yes    Who is being called back? Allen Lloyd   Call Back phone number: [phone]

## 2022-09-09 ENCOUNTER — Encounter: Admit: 2022-09-09 | Discharge: 2022-09-09 | Payer: MEDICARE

## 2022-09-10 ENCOUNTER — Encounter: Admit: 2022-09-10 | Discharge: 2022-09-10 | Payer: MEDICARE

## 2022-09-10 NOTE — Telephone Encounter
Called and spoke with patient and updated him her recommendations. Patient verbalized understanding to get legs evaluated by PCP or Urgent care to rule out cellulitis.      Will cancel July imaging and reach out to scheduling to get him rescheduled to a Tuesday in September for imaging and OV.    Encouraged to reach back out if wounds do not continue to heal.

## 2022-09-10 NOTE — Telephone Encounter
-----   Message from Angelina Sheriff, MD sent at 09/10/2022  1:35 PM CDT -----  Regarding: RE: Pictures  Contact: [phone]  We can reschedule to September as planned     I cannot get a good view of the wounds. I would recommend evaluation with PCP or urgent care to ensure there is on cellulitis. If wound is not healing then please have him update [place] for an earlier evaluation    Thanks    Beckey Rutter  ----- Message -----  From: Lendon Ka, RN  Sent: 09/10/2022   9:45 AM CDT  To: Woody Seller, MD  Subject: FW: Pictures                                     HI Dr. Trixie Dredge,    Patient was last seen 05/27/22 for PVD follow up. At that time imaging scheduled in July was supposed to be canceled and moved to September. PV ABI+ arterial did not get canceled appropriately and is currently scheduled for 09/30/22. Patient reached ou  t requesting to cancel appointment for July as it was mis-scheduled. He also stated he cut is right leg on a spicket on a bucket he was moving a few weeks ago. States that his right leg has been hurting a lot recently, pain is increased with activity  . Pain starts in calf and goes up leg. States it feels sore and is slightly purple and red. Patient does state he believes a scab is starting to form over one of the wounds. See attached pictures of leg. Please advise of recommendations regarding fol  low up and scheduling of imaging studies at this time given new symptoms and wounds.     Thank you,    Dorene Grebe, RN

## 2022-10-07 ENCOUNTER — Encounter: Admit: 2022-10-07 | Discharge: 2022-10-07 | Payer: MEDICARE

## 2022-11-29 ENCOUNTER — Encounter: Admit: 2022-11-29 | Discharge: 2022-11-29 | Payer: MEDICARE

## 2022-12-07 ENCOUNTER — Ambulatory Visit: Admit: 2022-12-07 | Discharge: 2022-12-07 | Payer: MEDICARE

## 2022-12-07 ENCOUNTER — Encounter: Admit: 2022-12-07 | Discharge: 2022-12-07 | Payer: MEDICARE

## 2022-12-07 DIAGNOSIS — E119 Type 2 diabetes mellitus without complications: Secondary | ICD-10-CM

## 2022-12-07 DIAGNOSIS — Z86718 Personal history of other venous thrombosis and embolism: Secondary | ICD-10-CM

## 2022-12-07 DIAGNOSIS — R609 Edema, unspecified: Secondary | ICD-10-CM

## 2022-12-07 DIAGNOSIS — I739 Peripheral vascular disease, unspecified: Secondary | ICD-10-CM

## 2022-12-07 DIAGNOSIS — I10 Essential (primary) hypertension: Secondary | ICD-10-CM

## 2022-12-07 DIAGNOSIS — M79606 Pain in leg, unspecified: Secondary | ICD-10-CM

## 2022-12-07 DIAGNOSIS — E785 Hyperlipidemia, unspecified: Secondary | ICD-10-CM

## 2022-12-07 DIAGNOSIS — I2510 Atherosclerotic heart disease of native coronary artery without angina pectoris: Secondary | ICD-10-CM

## 2022-12-07 DIAGNOSIS — I720 Aneurysm of carotid artery: Secondary | ICD-10-CM

## 2022-12-07 DIAGNOSIS — I5020 Unspecified systolic (congestive) heart failure: Secondary | ICD-10-CM

## 2022-12-07 NOTE — Patient Instructions
Follow-Up:    -Thank you for allowing [place] to participate in your care today. Your After Visit Summary is being completed by Eunice Blase, RN.    -We would like you to follow up in  6 months with Woody Seller, MD  -The schedule is released approximately 4-5 months in advance. You will be called by our scheduling department to make an appointment and you will also receive a notification via MyChart to self-schedule.  However, if you would like to call to make this appointment, please call [phone].    -Please schedule the following testing at check out, or by calling our scheduling line: ABI's-Rest    Changes From Today's Office Visit  If your wound gets worse, please call our office at [phone].  This is a voicemail only line, but your call will be returned.  Orders placed for ABI's to be done in 6 months on same day as office visit with Dr Trixie Dredge  Follow up with Dr Trixie Dredge in 6 months with ABI's to be done prior    Contacting our office:    -Business Hours: Monday-Friday, 8:00 am-4:30 pm (excluding Holidays).     -For medical questions or concerns, please send [place] a message through your MyChart account or call the [facility] team nursing triage line at [phone]. Please leave a detailed message with your name, date of birth, and reason for your call.  If your message is received before 3:30pm, every effort will be made to call you back the same day.  Please allow time for [place] to review your chart prior to call back.     -For medication refills please start by contacting your pharmacy. You can also send [place] a prescription question through your MyChart or call the nurse triage line above.     -Should you have an immediate need of the weekend/nights and holidays, please call our on-call triage line at [phone].    [facility] nursing team fax number: [phone]    -You may receive a survey in the upcoming weeks from [facility]. Your feedback is important to [place] and helps [place] continue to improve patient care and patient satisfaction.     -Please feel free to call our Financial Department at [phone] with any questions or concerns about estimated cost of testing or imaging ordered today. We are happy to provide CPT codes upon request.    Results & Testing Follow Up:    -Please allow 5-7 business days for the results of any testing to be reviewed. Please call our office if you have not heard from a nurse within this time frame.    -Should you choose to complete testing at an outside facility, please contact our office after completion of testing so that we can ensure that we have received results for your provider to review.    Lab and test results:  As a part of the CARES act, starting 06/14/2019, some results will be released to you via MyChart immediately and automatically.  You may see results before your provider sees them; however, your provider will review all these results and then they, or one of their team, will notify you of result information and recommendations.   Critical results will be addressed immediately, but otherwise, please allow [place] time to get back with you prior to you reaching out to [place] for questions.  This will usually take about 72 hours for labs and 5-7 days for procedure test results.

## 2022-12-07 NOTE — Progress Notes
Date of Service: 12/07/2022    Allen Lloyd is a 67 y.o. male.       HPI     Allen Lloyd is here today for follow up of peripheral arterial disease.    He has a history of peripheral arterial disease and was previously following with the vascular surgery team at [facility].  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 He elected to transfer his care to [place]. He currently denies symptoms of claudication or rest pain.  He has developed ulcerations on the anterior leg in the past secondary to injuries that have typically healed within a few weeks.  He currently denies any ulcerations.  He denies any limitation to activity secondary to known peripheral arterial disease.    He also has a history of ischemic cardiac disease and underwent CABG x 4 in 2013.  The left leg great saphenous vein was used as a conduit.  Since then he has been noticing progressively worsening skin changes including swelling, dryness, hyperpigmentation.  He is also noticed varicose veins particularly at the the right medial thigh but currently not bothersome.  He reports he was also diagnosed with a DVT back in 2013 and has been on anticoagulation with Xarelto 20 mg since.  He currently intermittently wears compression stockings but overall finds them bothersome to wear them consistently.    He follows with Dr. Arna Medici with [facility] for history of cardiac disease.     He developed a wound on the left leg after hitting it on the side of a bucket. He has been following with wound care for the last 6 weeks. He feels the wound over has been healing, although slowly. No concern for infection.     He continues to stay active (although not necessarily exercising as recommended) and denies lifestyle limiting claudication. He does report experiencing bilateral calf pain (R>L) with prolonged walking that improved with rest. No rest pain.     He will be going on a cruise and traveling to [facility] in the next several months.          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    Cellulitis 08/25/2021    Cardiomyopathy, unspecified type (HCC) 06/23/2021    Chronic anticoagulation on Xarelto 12/10/2020    Unstable angina (HCC) 12/09/2020    Edema 12/08/2020    Essential hypertension 12/08/2020    Low platelet count (HCC) 12/08/2020    Type 2 diabetes mellitus (HCC) 12/08/2020    CAD (coronary artery disease) 12/08/2020     04/16/2003 CABG x 4 LIMA to LAD, SVG to PDA, SVG to Diag, SVG to OM  01/16/2012 Heart Cath PCT/Stent placement x 3 RCA 3.5X12 (2), 3.5x22    09/20/2012 Heart Cath: 46fr femoral access Surgical consult    09/21/2012 Cutting balloon and stent x 3 of RCA graft     02/15/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  LAD Mid-vessel lesion 100%. Mid-vessel Lesion:  75% stenosis.  Distal vessel lesion: 75% stenosis.  Lt Circ:  Mid-vessel lesion: 100%.  Right coronary Distal vessel lesion 80% in-stent recurrent stenosis, at the sited of prior intervention #2. Right posterior descending: Proximal vessel lesion:  85%in-stent recurrent stenosis.  RCA posterolateral extension: Lesion 80% in-stent recurrent stenosis.  SVG to right coronary, from the aorta; Proximal anastomosis lesion. There is a 100% stenosis.  SVG to 1st OM from aorta:  Proximal anastomosis lesion 100% stenosis.  SVG to 1st Diagonal from the aortal:  Proximal anastomosis lesion:  100% stenosis.  Refer to Dr. Trinidad Curet for PCI if possible.       Hyperlipemia 12/08/2020    PVD (peripheral vascular disease) (HCC) 12/08/2020     09/23/2017 PV Venous Lower Extremity Right ([facility]): Findings suggesting thrombus in the femoral and popliteal veins.   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    04/09/2015 MR Angiography Lower Extremities ([facility]):  There are three renal arteries on the left.  There is a single renal artery on the right.  There is no significant abnormality of the abdominal aorta, common iliac arteries or external iliac arteries bilaterally. There is a moderate to high grade short segmental stenosis of the right common femoral artery with a stenosis of approximately 70%. The right superficial femoral and popliteal arteries are patent. There is limited visualization of the arteries below the knee due to venous contamination and superimposed venous structures. There appears to be a patent right posterior tibial artery to the ankle, although there are multiple areas of moderate stenosis proximally. There appears to be occlusion with reconstitution distally of the anterior tibial and peroneal arteries on the right. On the left there is an occluded anterior tibial artery proximally. There is a patent left posterior tibial artery with a moderate stenosis proximally. The left peroneal artery is patent proximally and appears to be occluded distally.        History of DVT (deep vein thrombosis) 12/08/2020         ROS    Physical Exam   Vitals reviewed.  Abdominal: Normal appearance.   Musculoskeletal:         General: No tenderness.      Right lower leg: Edema present.      Comments: Right leg: Significant hyperpigmentation of the leg with superficial skin breakdown on the anterior leg, no drainage, surrounding erythema, or tenderness to palpation. 1+ pitting edema    Left leg: Significant hyperpigmentation of the distal leg, no skin breakdown or ulceration, no significant pitting edema   Neurological: He is alert.   Skin: Skin is warm and dry.         Cardiovascular Studies      Cardiovascular Health Factors  Vitals BP Readings from Last 3 Encounters:   12/07/22 117/70   12/07/22 122/77   12/07/22 122/77     Wt Readings from Last 3 Encounters:   12/07/22 99.3 kg (219 lb)   12/07/22 96.2 kg (212 lb)   12/07/22 96.2 kg (212 lb)     BMI Readings from  Last 3 Encounters:   12/07/22 29.70 kg/m?   12/07/22 28.75 kg/m?   12/07/22 28.75 kg/m?      Smoking Social History     Tobacco Use   Smoking Status Former    Current packs/day: 0.00    Types: Cigarettes    Quit date: 03/15/1968    Years since quitting: 54.7   Smokeless Tobacco Never      Lipid Profile Cholesterol   Date Value Ref Range Status   08/23/2021 76 <200 MG/DL Final     HDL   Date Value Ref Range Status   08/23/2021 31 (L) >40 MG/DL Final     LDL   Date Value Ref Range Status   08/23/2021 27 <100 mg/dL Final     Triglycerides   Date Value Ref Range Status   08/23/2021 144 <150 MG/DL Final      Blood Sugar Hemoglobin A1C   Date Value Ref Range Status   05/26/2022 7.4 (H) 4.5 - 6.5 Final     Glucose   Date Value Ref Range Status   07/26/2022 174 (H) 70 - 105 Final   12/08/2021 199 (H) 70 - 105 Final   08/25/2021 128 (H) 70 - 100 MG/DL Final   60/45/4098 119 (H) 65 - 99 Final     Glucose, POC   Date Value Ref Range Status   08/25/2021 154 (H) 70 - 100 MG/DL Final   14/78/2956 213 (H) 70 - 100 MG/DL Final   08/65/7846 962 (H) 70 - 100 MG/DL Final          Problems Addressed Today  Encounter Diagnoses   Name Primary?    PVD (peripheral vascular disease) (HCC)     Pain of lower extremity, unspecified laterality     Edema, unspecified type     History of DVT (deep vein thrombosis)        Assessment and Plan     Mr. Petrosyan is here today for follow up of peripheral arterial disease.      Peripheral arterial disease:  He previously followed at [facility] for management and has a known right popliteal artery occlusion.  Arterial testing today Right ABI of 0.78 and left ABI of 1.06 which are fairly stable from prior. He has developed a wound on the right leg (anterior leg) following an injury and has been attending wound care. He feels the wound is healing and on exam is fairly superficial and without signs of infection. He continues to remain active and reports R>L claudication after prolonged walking. No limitation to daily activities. He currently denies any symptoms of rest pain. Discussed referral to supervised exercise therapy but he would prefer to try the home walking program. I have asked him to keep [place] updated on the wound healing of the right leg wound. If there is worsening we can discuss with our interventional colleagues.     Lower extremity edema:   We checked a venous insufficiency ultrasound which shows old blood clot in the right femoral and popliteal veins.  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 He is on chronic anticoagulation with Xarelto 20 mg daily.     Atherosclerotic risk factor optimization  He follows with Dr. Arna Medici for cardiac care.  He is on appropriate therapy for atherosclerotic risk factor optimization.    Follow up in 6 months with repeat ABI. Sooner if  he develops change in symptoms of worsening of right leg wound.            Current Medications (including today's revisions)   amLODIPine (NORVASC) 10 mg tablet Take one tablet by mouth daily. Hold until follow up with PCP or cardiology    aspirin EC (ASPIR-LOW) 81 mg tablet Take one tablet by mouth daily.    atorvastatin (LIPITOR) 80 mg tablet TAKE 1 TABLET BY MOUTH DAILY    carvediloL (COREG) 12.5 mg tablet TAKE ONE TABLET BY MOUTH TWICE DAILY. TAKE WITH FOOD.    dapagliflozin (FARXIGA) 10 mg tablet Take one tablet by mouth daily. Indications: heart failure with reduced ejection fraction    eplerenone (INSPRA) 25 mg tablet Take one tablet by mouth daily.    ezetimibe (ZETIA) 10 mg tablet TAKE 1 TABLET BY MOUTH DAILY    gabapentin (NEURONTIN) 400 mg capsule Take one capsule by mouth three times daily.    HYDROcodone/acetaminophen (NORCO) 10/325 mg tablet Take one tablet by mouth every 6 hours as needed.    insulin detemir U-100(+) (LEVEMIR) 100 unit/mL vial Inject twenty Units to thirty two Units under the skin daily. Sliding scale    lisinopriL (ZESTRIL) 2.5 mg tablet Take one tablet by mouth daily. Hold until follow up with PCP or cardiology    metFORMIN (GLUCOPHAGE) 1,000 mg tablet Take one tablet by mouth daily.    nitroglycerin (NITROSTAT) 0.4 mg tablet Place one tablet under tongue every 5 minutes as needed for Chest Pain. Max of 3 tablets, call 911.    TRADJENTA 5 mg tablet Take one tablet by mouth daily.    XARELTO 20 mg tablet TAKE 1 TABLET BY MOUTH DAILY

## 2022-12-28 ENCOUNTER — Encounter: Admit: 2022-12-28 | Discharge: 2022-12-28 | Payer: MEDICARE

## 2022-12-28 MED ORDER — ATORVASTATIN 80 MG PO TAB
80 mg | ORAL_TABLET | Freq: Every day | ORAL | 3 refills | Status: AC
Start: 2022-12-28 — End: ?

## 2023-01-11 ENCOUNTER — Encounter: Admit: 2023-01-11 | Discharge: 2023-01-11 | Payer: MEDICARE

## 2023-01-11 MED ORDER — EPLERENONE 25 MG PO TAB
25 mg | ORAL_TABLET | Freq: Every day | ORAL | 3 refills | 90.00000 days | Status: AC
Start: 2023-01-11 — End: ?

## 2023-02-08 ENCOUNTER — Encounter: Admit: 2023-02-08 | Discharge: 2023-02-08 | Payer: MEDICARE

## 2023-02-13 ENCOUNTER — Encounter: Admit: 2023-02-13 | Discharge: 2023-02-13 | Payer: MEDICARE

## 2023-02-14 ENCOUNTER — Encounter: Admit: 2023-02-14 | Discharge: 2023-02-14 | Payer: MEDICARE

## 2023-02-14 DIAGNOSIS — C44529 Squamous cell carcinoma of skin of other part of trunk: Secondary | ICD-10-CM

## 2023-03-29 ENCOUNTER — Encounter: Admit: 2023-03-29 | Discharge: 2023-03-29 | Payer: MEDICARE

## 2023-03-29 ENCOUNTER — Ambulatory Visit: Admit: 2023-03-29 | Discharge: 2023-03-29 | Payer: MEDICARE

## 2023-03-29 DIAGNOSIS — C44529 Squamous cell carcinoma of skin of other part of trunk: Secondary | ICD-10-CM

## 2023-03-29 NOTE — Progress Notes
MOHS CONSULT NOTE    Dermatologic Surgery Consult Note:    Subjective:       Chief Complaint: skin cancer    History of Present Illness  Allen Lloyd is a 68 y.o. male who presents for consultation for treatment of skin cancer of unknown duration.     Referring provider: Marcelino Duster [facility] [place]  Site:mid upper back  Diagnosis: Squamous cell carcinoma         Reviewed Epic   Past Medical History:    Coronary artery disease    DM (diabetes mellitus) (HCC)    Essential hypertension    HFrEF (heart failure with reduced ejection fraction) (HCC)    History of DVT (deep vein thrombosis)    HLD (hyperlipidemia)    PVD (peripheral vascular disease) (HCC)     Surgical History:   Procedure Laterality Date    CORONARY ARTERY BYPASS GRAFT       Family History   Problem Relation Name Age of Onset    Unknown to Patient Mother      Unknown to Patient Father       Social History     Socioeconomic History    Marital status: Divorced   Tobacco Use    Smoking status: Former     Current packs/day: 0.00     Types: Cigarettes     Quit date: 03/15/1968     Years since quitting: 55.0    Smokeless tobacco: Never   Vaping Use    Vaping status: Never Used   Substance and Sexual Activity    Alcohol use: Not Currently    Drug use: Not Currently     Vaping/E-liquid Use    Vaping Use Never User                    Objective:          amLODIPine (NORVASC) 10 mg tablet Take one tablet by mouth daily. Hold until follow up with PCP or cardiology    aspirin EC (ASPIR-LOW) 81 mg tablet Take one tablet by mouth daily.    atorvastatin (LIPITOR) 80 mg tablet TAKE 1 TABLET BY MOUTH EVERY DAY    carvediloL (COREG) 12.5 mg tablet TAKE ONE TABLET BY MOUTH TWICE DAILY. TAKE WITH FOOD.    dapagliflozin (FARXIGA) 10 mg tablet Take one tablet by mouth daily. Indications: heart failure with reduced ejection fraction    eplerenone (INSPRA) 25 mg tablet Take one tablet by mouth daily.    ezetimibe (ZETIA) 10 mg tablet TAKE 1 TABLET BY MOUTH DAILY gabapentin (NEURONTIN) 400 mg capsule Take one capsule by mouth three times daily.    HYDROcodone/acetaminophen (NORCO) 10/325 mg tablet Take one tablet by mouth every 6 hours as needed.    insulin detemir U-100(+) (LEVEMIR) 100 unit/mL vial Inject twenty Units to thirty two Units under the skin daily. Sliding scale    lisinopriL (ZESTRIL) 2.5 mg tablet Take one tablet by mouth daily. Hold until follow up with PCP or cardiology    metFORMIN (GLUCOPHAGE) 1,000 mg tablet Take one tablet by mouth daily.    nitroglycerin (NITROSTAT) 0.4 mg tablet Place one tablet under tongue every 5 minutes as needed for Chest Pain. Max of 3 tablets, call 911.    TRADJENTA 5 mg tablet Take one tablet by mouth daily.    XARELTO 20 mg tablet TAKE 1 TABLET BY MOUTH DAILY     Vitals:    03/29/23 1556   PainSc: Nine   Weight: 100.2  kg (221 lb)   Height: 182.9 cm (6')     Body mass index is 29.97 kg/m?[name]     Physical Exam    WDWN male in NAD.  AAO x 3.   Mood/affect is pleasant and appropriate. Physical examination revealed the following:  - Ill-defined lesion measuring 0.4 cm on the upper mid bac    Data Reviewed: pathology report, biopsy photo         Assessment and Plan:    # Biopsy-proven Squamous cell carcinoma   - We discussed the malignant nature of the lesion. Treatment options including surgery, destruction, radiation, and topical therapy discussed.  Excision would be most appropriate based on AUC and size. The patient consented to have the procedure. Will schedule for excision    - Sunscreen/ sun avoidance reinforced. Patient instructed to continue to follow-up with primary dermatologist for routine skin screenings.    RTC for excision

## 2023-03-30 ENCOUNTER — Encounter: Admit: 2023-03-30 | Discharge: 2023-03-30 | Payer: MEDICARE

## 2023-03-30 MED ORDER — XARELTO 20 MG PO TAB
20 mg | ORAL_TABLET | Freq: Every day | ORAL | 3 refills | 30.00000 days | Status: AC
Start: 2023-03-30 — End: ?

## 2023-03-31 ENCOUNTER — Encounter: Admit: 2023-03-31 | Discharge: 2023-03-31 | Payer: MEDICARE

## 2023-03-31 DIAGNOSIS — C4492 Squamous cell carcinoma of skin, unspecified: Principal | ICD-10-CM

## 2023-04-12 ENCOUNTER — Encounter: Admit: 2023-04-12 | Discharge: 2023-04-12 | Payer: MEDICARE

## 2023-04-18 ENCOUNTER — Encounter: Admit: 2023-04-18 | Discharge: 2023-04-18 | Payer: MEDICARE

## 2023-04-19 ENCOUNTER — Encounter: Admit: 2023-04-19 | Discharge: 2023-04-19 | Payer: MEDICARE

## 2023-04-19 ENCOUNTER — Ambulatory Visit: Admit: 2023-04-19 | Discharge: 2023-04-19 | Payer: MEDICARE

## 2023-04-19 DIAGNOSIS — I110 Hypertensive heart disease with heart failure: Secondary | ICD-10-CM

## 2023-04-19 NOTE — Patient Instructions
 Thank you for visiting our office today.    We would like to make the following medication adjustments:  NONE       Otherwise continue the same medications as you have been doing.          We will be pursuing the following tests after your appointment today:            We will plan to see you back in 6 months.  Please call us in the meantime with any questions or concerns.        Please allow 5-7 business days for our providers to review your results. All normal results will go to MyChart. If you do not have Mychart, it is strongly recommended to get this so you can easily view all your results. If you do not have mychart, we will attempt to call you once with normal lab and testing results. If we cannot reach you by phone with normal results, we will send you a letter.  If you have not heard the results of your testing after one week please give [place] a call.       Your Cardiovascular Medicine [place] Team Brett Canales, Pilar Jarvis, Shawna Orleans, and [place])  phone number is [phone].
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
## 2023-04-21 ENCOUNTER — Encounter: Admit: 2023-04-21 | Discharge: 2023-04-21 | Payer: MEDICARE

## 2023-04-26 ENCOUNTER — Ambulatory Visit: Admit: 2023-04-26 | Discharge: 2023-04-27 | Payer: MEDICARE

## 2023-04-26 ENCOUNTER — Encounter: Admit: 2023-04-26 | Discharge: 2023-04-26 | Payer: MEDICARE

## 2023-04-26 DIAGNOSIS — C44529 Squamous cell carcinoma of skin of other part of trunk: Secondary | ICD-10-CM

## 2023-04-26 MED ORDER — LIDOCAINE-EPINEPHRINE 1 %-1:100,000 IJ SOLN
15 mL | Freq: Once | 0 refills | Status: CP
Start: 2023-04-26 — End: ?
  Administered 2023-04-26: 21:00:00 15 mL

## 2023-04-26 NOTE — Procedures
PROCEDURE NOTE    Patient Name: Allen Lloyd  Date of Birth: [birth year]    Procedure Performed: Excision and intermediate closure  Surgeon: Gloriann Loan, MD  Assistant Surgeon: [place] Joahan Swatzell, MD    Diagnosis: Squamous cell carcinoma  Lesion Location: upper mid back  Lesion Size: 0.9 x 0.8 cm  Defect Size: 1.9 x 1.8 cm  Final Repair Length: 4.7 cm    Total Anesthesia: 15 ml 1% lidocaine with epinephrine 1:100,000  Complications: None    Indications for Surgery: Malignant nature, need for margin evaluation    Procedure Note: The nature of the patient's diagnosis and prognosis were discussed and risks, benefits, and alternatives to surgery were discussed with the patient.  Specifically, the risks of scarrring, infection, bruising, altered appearance, recurrence, incomplete removal, and poor cosmetic result were discussed and the patient expressed understanding. The patient's questions were answered and written informed consent was obtained. A time-out was performed in which the patient's identity, location of the lesion, and today's operation were confirmed.        A fusiform shape with 5 cm margins was drawn around the lesion with a surgical marker, and the lesional area was injected with 1% lidocaine with epinephrine 1:100,000. The area was prepped and draped in the usual sterile fashion. The margins were excised as drawn to the level of the subcutaneous fat with a #15 blade and sent for histopathologic examination.  To improve cosmetic outcome, facilitate closure, and decrease wound tension, the wound edges of the resulting defect were undermined at the level of the subcutaneous fat. Hemostasis was achieved with electrocautery. To optimize cosmetic result and decrease the chance of wound dehiscence, a layered closure was performed.  The subcutaneous tissue and dermis was closed with interrupted vertical mattress sutures using 3-0 vicryl suture. The epidermis was closed with running epidermal sutures of 3-0 prolene suture. A pressure dressing was applied over the wound.  The patient tolerated the procedure well without complications. Written and verbal wound care instructions were given to the patient. The patient will return as indicated for a wound check/suture removal. Patient plans to remove sutures at home in 14 days. The patient was educated to follow-up with primary dermatologist for routine skin cancer screenings.

## 2023-04-26 NOTE — Progress Notes
ATTESTATION       I performed the key components of the procedure including site identification, discussion with patient, excision type, choice, final closure, and post-procedure instructions and was immediately available within two minutes during the entire procedure.      Staff name:  Gloriann Loan MD Date:  04/26/2023

## 2023-04-27 ENCOUNTER — Encounter: Admit: 2023-04-27 | Discharge: 2023-04-27 | Payer: MEDICARE

## 2023-05-05 ENCOUNTER — Encounter: Admit: 2023-05-05 | Discharge: 2023-05-05 | Payer: MEDICARE

## 2023-05-17 ENCOUNTER — Ambulatory Visit: Admit: 2023-05-17 | Discharge: 2023-05-18 | Payer: MEDICARE

## 2023-05-17 ENCOUNTER — Encounter: Admit: 2023-05-17 | Discharge: 2023-05-17 | Payer: MEDICARE

## 2023-05-17 ENCOUNTER — Ambulatory Visit: Admit: 2023-05-17 | Discharge: 2023-05-17 | Payer: MEDICARE

## 2023-05-17 DIAGNOSIS — M79606 Pain in leg, unspecified: Secondary | ICD-10-CM

## 2023-05-17 DIAGNOSIS — S81809A Unspecified open wound, unspecified lower leg, initial encounter: Secondary | ICD-10-CM

## 2023-05-17 DIAGNOSIS — I739 Peripheral vascular disease, unspecified: Secondary | ICD-10-CM

## 2023-05-17 DIAGNOSIS — Z86718 Personal history of other venous thrombosis and embolism: Secondary | ICD-10-CM

## 2023-05-17 DIAGNOSIS — R609 Edema, unspecified: Secondary | ICD-10-CM
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
## 2023-05-17 NOTE — Patient Instructions
 Follow-Up:    -Thank you for allowing [place] to participate in your care today. Your After Visit Summary is being completed by Lendon Ka, RN.    -We would like you to follow up in  1 months with Kinnie Feil, APRN and   -The schedule is released approximately 4-5 months in advance. You will be called by our scheduling department to make an appointment and you will also receive a notification via MyChart to self-schedule.  However, if you would like to call to make this appointment, please call [phone].    -Please schedule the following testing at check out, or by calling our scheduling line:     Changes From Today's Office Visit     Follow up in 1 month with Kinnie Feil     6 month follow up with Dr. Trixie Dredge    Referral to [facility]   Contacting our office:    -Business Hours: Monday-Friday, 8:00 am-4:30 pm (excluding Holidays).     -For medical questions or concerns, please send [place] a message through your MyChart account or call the [facility] team nursing triage line at [phone]. Please leave a detailed message with your name, date of birth, and reason for your call.  If your message is received before 3:30pm, every effort will be made to call you back the same day.  Please allow time for [place] to review your chart prior to call back.     -For medication refills please start by contacting your pharmacy. You can also send [place] a prescription question through your MyChart or call the nurse triage line above.     [facility] nursing team fax number: [phone]    -You may receive a survey in the upcoming weeks from [facility]. Your feedback is important to [place] and helps [place] continue to improve patient care and patient satisfaction.     -Please feel free to call our Financial Department at [phone] with any questions or concerns about estimated cost of testing or imaging ordered today. We are happy to provide CPT codes upon request.    Results & Testing Follow Up:    -Please allow 5-7 business days for the results of any testing to be reviewed. Please call our office if you have not heard from a nurse within this time frame.    -Should you choose to complete testing at an outside facility, please contact our office after completion of testing so that we can ensure that we have received results for your provider to review.    Lab and test results:  As a part of the CARES act, starting 06/14/2019, some results will be released to you via MyChart immediately and automatically.  You may see results before your provider sees them; however, your provider will review all these results and then they, or one of their team, will notify you of result information and recommendations.   Critical results will be addressed immediately, but otherwise, please allow [place] time to get back with you prior to you reaching out to [place] for questions.  This will usually take about 72 hours for labs and 5-7 days for procedure test results.

## 2023-05-18 ENCOUNTER — Encounter: Admit: 2023-05-18 | Discharge: 2023-05-18 | Payer: MEDICARE

## 2023-05-18 NOTE — Progress Notes
 Orders faxed to [facility] at [phone] (number provided by operator)  Fax confirmation:

## 2023-06-29 ENCOUNTER — Encounter: Admit: 2023-06-29 | Discharge: 2023-06-29 | Payer: MEDICARE

## 2023-06-29 NOTE — Telephone Encounter
 Reviewed with Dr. Neelati with orders to have pt proceed to ER now.     Returned call to pt and reviewed recommendations. Pt states he went to the ER a week ago and they did not do anything for him. Advised pt since he is having worsening symptoms, it would be best that he be evaluated in the ER this evening. Pt verbalizes understanding. All questions addressed.

## 2023-06-29 NOTE — Telephone Encounter
 Pt left VM stating he just scheduled an OV for 6/3 at 0900 with Dr. Quenten Brunette, but will need to push that appt back to 10 am if possible due to transportation with his daughter. Pt states he is having a lot of issues with his foot and thinks he has a blood clot that needs to come out now.     Returned call to pt. Advised pt that Dr. Quenten Brunette does not have any other availability later than 9 am on 6/3. Advised pt that Dr. Quenten Brunette had recommended an OV in 1 month with Augustina Block followed by appt with Dr. Quenten Brunette in 6 months. Pt states he was told NP was out of the office so he scheduled OV with Dr. Quenten Brunette in June because he feels like he needs attention. The wound on his right leg (inside of lower leg near ankle) has worsened over the past two weeks. Pt states the drainage is real bad. He will be walking around his house and he will leave puddles behind him of drainage. Pt states the wound has gotten deeper/wider. The drainage is clear. Pt also states his right foot has swelled up real bad to the point where he feels like he can't put his shoes on. His feet are red and purple and he feels like all the bones in his foot are broken. Pt states he has pain at rest and when walking. He does not report any fevers, but has been feeling really cold. He does wear knee high compression stockings over the wound dressing on his leg. Pt has not missed any doses of Xarelto .     Pt states he is scheduled for his first wound clinic visit at [facility] on 4/22. He has been talking to his PCP and his PCP had recommended he go to the ER for evaluation and possible admission. Pt states he went to the ER and was told there was no sign of infection based on the blood work. Pt states the was told the x ray of his right foot did not show any broken bones. Pt states the provider at the ER told him they were not going to admit him. He was discharged on a 14 day course of antibiotics. Pt states the hospital called him 7 days later and switched him to a different antibiotic which he has now finished. He has not noticed any improvement in wound healing. Pt states he has tried all sorts of different dressings like we to dry and Medihoney which was recommended by his PCP. He has been gently washing the wound.     Advised pt I would review information with Dr. Quenten Brunette and follow up with further recommendations. Pt verbalizes understanding. All questions addressed.

## 2023-06-29 NOTE — Telephone Encounter
 Patient called regarding a RLE wound that has not healed. Patient reports conditions of wound has not changed. Unable to send picture d/t patient not having Mychart portal messaging. He calls today because wound has not stopped leaking and reports that leaking has gotten worse per patient it appears the drainage is serosanguinous; denies pus.     Patient is placing silver alginate dressing and a nonstick gauze then paper tape which he dresses himself. Denies fever.     Patient has appt on Tuesday for wound dressing. After phone call, Dr. Quenten Brunette was made aware during clinic and patient is being sent to emergency room for further evaluation

## 2023-07-20 ENCOUNTER — Encounter: Admit: 2023-07-20 | Discharge: 2023-07-20 | Payer: MEDICARE

## 2023-08-09 ENCOUNTER — Encounter: Admit: 2023-08-09 | Discharge: 2023-08-09 | Payer: MEDICARE

## 2023-08-16 ENCOUNTER — Encounter: Admit: 2023-08-16 | Discharge: 2023-08-16 | Payer: MEDICARE

## 2023-08-16 ENCOUNTER — Ambulatory Visit: Admit: 2023-08-16 | Discharge: 2023-08-17 | Payer: MEDICARE

## 2023-08-16 DIAGNOSIS — Z86718 Personal history of other venous thrombosis and embolism: Secondary | ICD-10-CM

## 2023-08-16 NOTE — Progress Notes
 Date of Service: 08/16/2023    Allen Lloyd is a 68 y.o. male.       HPI     Allen Lloyd is here today for follow up of peripheral arterial disease.    He has a history of peripheral arterial disease and was previously following with the vascular surgery team at [facility].  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 He elected to transfer his care to [place]. He currently denies symptoms of claudication or rest pain.  He has developed ulcerations on the anterior leg in the past secondary to injuries that have typically healed within a few weeks.  He currently denies any ulcerations.  He denies any limitation to activity secondary to known peripheral arterial disease.    He also has a history of ischemic cardiac disease and underwent CABG x 4 in 2013.  The left leg great saphenous vein was used as a conduit.  Since then he has been noticing progressively worsening skin changes including swelling, dryness, hyperpigmentation.  He is also noticed varicose veins particularly at the the right medial thigh but currently not bothersome.  He reports he was also diagnosed with a DVT back in 2013 and has been on anticoagulation with Xarelto 20 mg since.  He currently intermittently wears compression stockings but overall finds them bothersome to wear them consistently.    He follows with Dr. Anda Bamberg with [place] [facility] for history of cardiac disease.     He previously had a wound on this right leg after running into a bucket but that wound has since healed.     He visited the [place] in December and injured this right anterior leg on rocks in the water. Since then the wound on the leg has not healed. He has been doing his own wound care including applying super glue to the affected area. He denies rest pain but has been experiencing complete left leg pain from the thigh downwards.     We referred him to wound care and he is now established with the clinic at [facility]. He meets with them monthly and does wound care himself at home. He feels the wound has healed so some extent and no long leaking fluid. He developed another wound in front of the leg after walking into a bucket. That wound he feels is healing.          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    Cellulitis 08/25/2021    Cardiomyopathy, unspecified type (CMS-HCC) 06/23/2021    Chronic anticoagulation on Xarelto 12/10/2020    Unstable angina (CMS-HCC) 12/09/2020    Edema 12/08/2020    Essential hypertension 12/08/2020    Low platelet count 12/08/2020    Type 2 diabetes mellitus (CMS-HCC) 12/08/2020    CAD (coronary artery disease) 12/08/2020     04/16/2003 CABG x 4 LIMA to  LAD, SVG to PDA, SVG to Diag, SVG to OM  01/16/2012 Heart Cath PCT/Stent placement x 3 RCA 3.5X12 (2), 3.5x22    09/20/2012 Heart Cath: 77fr femoral access Surgical consult    09/21/2012 Cutting balloon and stent x 3 of RCA graft     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  LAD Mid-vessel lesion 100%. Mid-vessel Lesion:  75% stenosis.  Distal vessel lesion: 75% stenosis.  Lt Circ:  Mid-vessel lesion: 100%.  Right coronary Distal vessel lesion 80% in-stent recurrent stenosis, at the sited of prior intervention #2. Right posterior descending: Proximal vessel lesion:  85%in-stent recurrent stenosis.  RCA posterolateral extension: Lesion 80% in-stent recurrent stenosis.  SVG to right coronary, from the aorta; Proximal anastomosis lesion. There is a 100% stenosis.  SVG to 1st OM from aorta:  Proximal anastomosis lesion 100% stenosis.  SVG to 1st Diagonal from the aortal:  Proximal anastomosis lesion:  100% stenosis.  Refer to Dr. Catherine Cloud for PCI if possible.       Hyperlipemia 12/08/2020    PVD (peripheral vascular disease) 12/08/2020     09/23/2017 PV Venous Lower Extremity Right ([facility]): Findings suggesting thrombus in the femoral and popliteal veins.   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    04/09/2015 MR Angiography Lower Extremities ([facility]):  There are three renal arteries on the left.  There is a single renal artery on the right.  There is no significant abnormality of the abdominal aorta, common iliac arteries or external iliac arteries bilaterally. There is a moderate to high grade short segmental stenosis of the right common femoral artery with a stenosis of approximately 70%. The right superficial femoral and popliteal arteries are patent. There is limited visualization of the arteries below the knee due to venous contamination and superimposed venous structures. There appears to be a patent right posterior tibial artery to the ankle, although there are multiple areas of moderate stenosis proximally. There appears to be occlusion with reconstitution distally of the anterior tibial and peroneal arteries on the right. On the left there is an occluded anterior tibial artery proximally. There is a patent left posterior tibial artery with a moderate stenosis proximally. The left peroneal artery is patent proximally and appears to be occluded distally.        History of DVT (deep vein thrombosis) 12/08/2020         ROS    Physical Exam   Vitals reviewed.  Abdominal: Normal appearance.   Musculoskeletal:         General: No tenderness.      Right lower leg: Edema present.      Comments: Right leg: Significant hyperpigmentation of the leg with superficial ulceration on the anterior leg, no drainage, surrounding erythema, or tenderness to palpation. 1+ pitting edema    Left leg: Significant hyperpigmentation of the distal leg, no skin breakdown or ulceration, no significant pitting edema   Neurological: He is alert.   Skin: Skin is warm and dry. No erythema.         Cardiovascular Studies  Cardiovascular Health Factors  Vitals BP Readings from Last 3 Encounters:   08/16/23 128/66   05/17/23 121/79   05/17/23 90/51     Wt Readings from Last 3 Encounters:   08/16/23 98.9 kg (218 lb)   05/17/23 99.8 kg (220 lb)   05/17/23 99.3 kg (219 lb)     BMI Readings from Last 3 Encounters:   08/16/23 29.57 kg/m?   05/17/23 29.84 kg/m?   05/17/23 29.70 kg/m?      Smoking Social History     Tobacco Use   Smoking Status Former    Current packs/day: 0.00    Average packs/day: 1 pack/day for 3.0 years (3.0 ttl pk-yrs)    Types: Cigarettes    Quit date: 03/15/1968    Years since quitting: 55.4   Smokeless Tobacco Never      Lipid Profile Cholesterol   Date Value Ref Range Status   08/23/2021 76 <200 MG/DL Final     HDL   Date Value Ref Range Status   08/23/2021 31 (L) >40 MG/DL Final     LDL   Date Value Ref Range Status   08/23/2021 27 <100 mg/dL Final     Triglycerides   Date Value Ref Range Status   08/23/2021 144 <150 MG/DL Final      Blood Sugar Hemoglobin A1C   Date Value Ref Range Status   03/28/2023 6.7 (H) 4.5 - 6.5 Final     Glucose   Date Value Ref Range Status   10/07/2022 108 (H) 70 - 105 Final   07/26/2022 174 (H) 70 - 105 Final   12/08/2021 199 (H) 70 - 105 Final     Glucose, POC   Date Value Ref Range Status   08/25/2021 154 (H) 70 - 100 MG/DL Final   47/82/9562 130 (H) 70 - 100 MG/DL Final   86/57/8469 629 (H) 70 - 100 MG/DL Final          Problems Addressed Today  Encounter Diagnoses Name Primary?    History of DVT (deep vein thrombosis) Yes       Assessment and Plan     Allen Lloyd is here today for follow up of peripheral arterial disease.      Peripheral arterial disease:  He previously followed at [facility] for management and has a known right popliteal artery occlusion.  Arterial testing today Right ABI of 0.67 with TI of 0.31 and left ABI of 1.16. Right side ABI and TBI is decreased from prior. He has developed a wound on the right leg (anterior leg) following an injury that occurred December 2024 while visiting the [place].  He currently has been doing his own skin care and applying new skin and the superglue on the wound and reports the wound has not worsened but also has not been healing.  He continues to remain active and reports R>L claudication after prolonged walking. He is now established with wound care and appears better than before but not healed. At this point with the ongoing wound and known popliteal artery stenosis I would like to refer to one of our interventional colleagues to discuss if intervention would be beneficial. Offered referral to [facility] home health but he declines and report he is not home often and feels comfortable managing the wound care himself.     Lower extremity edema:   We checked a venous insufficiency ultrasound which shows old blood clot in the right femoral and popliteal veins.  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 He is on chronic anticoagulation with Xarelto 20 mg daily.     Atherosclerotic risk factor optimization  He follows with Dr. Anda Bamberg for cardiac care.  He is on appropriate therapy for atherosclerotic risk factor optimization.               Current Medications (including today's revisions)   amLODIPine (NORVASC) 10 mg tablet Take one tablet by mouth daily. Hold until follow up with PCP or cardiology    aspirin EC (ASPIR-LOW) 81 mg tablet Take one tablet by mouth daily.    atorvastatin (LIPITOR) 80 mg tablet TAKE 1 TABLET BY MOUTH EVERY DAY    carvediloL (COREG) 12.5 mg tablet TAKE ONE TABLET BY MOUTH TWICE DAILY. TAKE WITH FOOD.    dapagliflozin (FARXIGA) 10 mg tablet Take one tablet by mouth daily. Indications: heart failure with reduced ejection fraction    eplerenone (INSPRA) 25 mg tablet Take one tablet by mouth daily.    ezetimibe (ZETIA) 10 mg tablet TAKE 1 TABLET BY MOUTH DAILY    gabapentin (NEURONTIN) 400 mg capsule Take one capsule by mouth three times daily.    HYDROcodone/acetaminophen (NORCO) 10/325 mg tablet Take one tablet by mouth every 6 hours as needed.    insulin detemir U-100(+) (LEVEMIR) 100 unit/mL vial Inject twenty Units to thirty two Units under the skin daily. Sliding scale    lisinopriL (ZESTRIL) 2.5 mg tablet Take one tablet by mouth daily. Hold until follow up with PCP or cardiology    metFORMIN (GLUCOPHAGE) 1,000 mg tablet Take one tablet by mouth daily.    nitroglycerin (NITROSTAT) 0.4 mg tablet Place one tablet under tongue every 5 minutes as needed for Chest Pain. Max of 3 tablets, call 911.    SANTYL 250 unit/gram topical ointment Apply 1 inch topically to affected area daily.    TRADJENTA 5 mg tablet Take one tablet by mouth daily.    XARELTO 20 mg tablet Take one tablet by mouth daily.

## 2023-08-16 NOTE — Patient Instructions
 Follow-Up:    -Thank you for allowing us  to participate in your care today. Your After Visit Summary is being completed by Lorilee Rooks, RN.    -We would like you to follow up in on 6/13 at 11:00 am with Fran Imus, MD  -The schedule is released approximately 4-5 months in advance. You will be called by our scheduling department to make an appointment and you will also receive a notification via MyChart to self-schedule.  However, if you would like to call to make this appointment, please call [phone].    -Please schedule the following testing at check out, or by calling our scheduling line:     Changes From Today's Office Visit       Contacting our office:    -Business Hours: Monday-Friday, 8:00 am-4:30 pm (excluding Holidays).     -For medical questions or concerns, please send us  a message through your MyChart account or call the [facility] team nursing triage line at [phone]. Please leave a detailed message with your name, date of birth, and reason for your call.  If your message is received before 3:30pm, every effort will be made to call you back the same day.  Please allow time for us  to review your chart prior to call back.     -For medication refills please start by contacting your pharmacy. You can also send us  a prescription question through your MyChart or call the nurse triage line above.     -Please allow a minimum of 10-14 business days for clearance from your cardiologist for procedures and/or FMLA, disability, or  DOT forms. An office visit or testing may be required for us  to provide clearance.     [facility] nursing team fax number: [phone]    -You may receive a survey in the upcoming weeks from [facility]. Your feedback is important to us  and helps us  continue to improve patient care and patient satisfaction.     -Please feel free to call our Financial Department at [phone] with any questions or concerns about estimated cost of testing or imaging ordered today. We are happy to provide CPT codes upon request.    Results & Testing Follow Up:    -Please allow 5-7 business days for the results of any testing to be reviewed. Please call our office if you have not heard from a nurse within this time frame.    -Should you choose to complete testing at an outside facility, please contact our office after completion of testing so that we can ensure that we have received results for your provider to review.    Lab and test results:  As a part of the CARES act, starting 06/14/2019, some results will be released to you via MyChart immediately and automatically.  You may see results before your provider sees them; however, your provider will review all these results and then they, or one of their team, will notify you of result information and recommendations.   Critical results will be addressed immediately, but otherwise, please allow us  time to get back with you prior to you reaching out to us  for questions.  This will usually take about 72 hours for labs and 5-7 days for procedure test results.

## 2023-08-23 ENCOUNTER — Encounter: Admit: 2023-08-23 | Discharge: 2023-08-23 | Payer: MEDICARE

## 2023-08-26 ENCOUNTER — Encounter: Admit: 2023-08-26 | Discharge: 2023-08-26 | Payer: MEDICARE

## 2023-08-26 NOTE — Telephone Encounter
 Who is calling? Patient- Min    Is the patient new or return? Return - Who is the primary Vascular provider? Dr. Inetta Hajj    What is the reason for the call? Patient arrived to the office about 30 minutes after the appointment would have started. Unfortunately, patient was unable to be seen at the time of his arrival. Patient was offered to be seen at 2:45pm but he was unable to take that time. Marcelline Must, Dr Garvin nurse advised she will call patient today and will reschedule.    Are there any other needs/concerns the patient has?  no   If yes, description of concern:    Patient was notified that if call was received after 3:30 pm the call may not be returned until the next business day. Yes    Who is being called back? Patient-Haile  Call Back phone number: [phone]

## 2023-09-22 ENCOUNTER — Encounter: Admit: 2023-09-22 | Discharge: 2023-09-22 | Payer: MEDICARE

## 2023-09-22 ENCOUNTER — Ambulatory Visit: Admit: 2023-09-22 | Discharge: 2023-09-23 | Payer: MEDICARE

## 2023-09-22 ENCOUNTER — Ambulatory Visit: Admit: 2023-09-22 | Discharge: 2023-09-22 | Payer: MEDICARE

## 2023-09-22 DIAGNOSIS — I10 Essential (primary) hypertension: Secondary | ICD-10-CM

## 2023-09-22 DIAGNOSIS — I739 Peripheral vascular disease, unspecified: Principal | ICD-10-CM

## 2023-09-22 DIAGNOSIS — Z86718 Personal history of other venous thrombosis and embolism: Secondary | ICD-10-CM

## 2023-09-22 DIAGNOSIS — E782 Mixed hyperlipidemia: Secondary | ICD-10-CM

## 2023-09-22 DIAGNOSIS — Z7901 Long term (current) use of anticoagulants: Secondary | ICD-10-CM

## 2023-09-22 DIAGNOSIS — I2510 Atherosclerotic heart disease of native coronary artery without angina pectoris: Secondary | ICD-10-CM

## 2023-09-22 DIAGNOSIS — S81801A Unspecified open wound, right lower leg, initial encounter: Secondary | ICD-10-CM

## 2023-09-22 NOTE — Progress Notes
 Date of Service: 09/22/2023    Allen Lloyd is a 68 y.o. male.       I am seeing this patient in consultation based on the request of  Jesse Nosbisch, Inetta SQUIBB, MD      CC:  PAD    HPI     68 years old patient with coronary artery disease, ischemic cardiomyopathy, history of CABG and coronary stenting, hypertension, hyperlipidemia, type 2 diabetes mellitus, obesity, history of smoking currently abstinent, history of peripheral vascular disease with history of severe right popliteal artery stenosis by arterial duplex in January 2024, history of DVT on chronic anticoagulation, referred to the vascular clinic for consultation due to nonhealing wound in the right distal calf since December 2024  The patient has right calf claudication that is chronic and lifestyle limiting.  It has been managed conservatively for a while.  In December 2024, he had an injury to the distal right calf and the wound never healed since then.  ABI performed in March 2025 was 0.67 on the right side.  TBI was 0.31.  No arterial duplex since January 2024.         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    Cellulitis 08/25/2021    Cardiomyopathy, unspecified type (CMS-HCC) 06/23/2021    Chronic anticoagulation on Xarelto  12/10/2020    Unstable angina (CMS-HCC) 12/09/2020    Edema 12/08/2020    Essential hypertension 12/08/2020    Low platelet count 12/08/2020    Type 2 diabetes mellitus (CMS-HCC) 12/08/2020    CAD (coronary artery disease) 12/08/2020     04/16/2003 CABG x 4 LIMA to LAD, SVG to PDA, SVG to Diag, SVG to OM  01/16/2012 Heart Cath PCT/Stent placement x 3 RCA 3.5X12 (2), 3.5x22    09/20/2012 Heart Cath: 18fr femoral access Surgical consult    09/21/2012 Cutting balloon and stent x 3 of RCA graft     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  LAD Mid-vessel lesion 100%. Mid-vessel Lesion:  75% stenosis.  Distal vessel lesion: 75% stenosis.  Lt Circ:  Mid-vessel lesion: 100%.  Right coronary Distal vessel lesion 80% in-stent recurrent stenosis, at the sited of prior intervention #2. Right posterior descending: Proximal vessel lesion:  85%in-stent recurrent stenosis.  RCA posterolateral extension: Lesion 80% in-stent recurrent stenosis.  SVG to right coronary, from the aorta; Proximal anastomosis lesion. There is a 100% stenosis.  SVG to 1st OM from aorta:  Proximal anastomosis lesion 100% stenosis.  SVG to 1st Diagonal from the aortal:  Proximal anastomosis lesion:  100% stenosis.  Refer to Dr. Mallie for PCI if possible.       Hyperlipemia 12/08/2020    PVD (peripheral vascular disease) 12/08/2020     09/23/2017 PV Venous Lower Extremity Right ([facility]): Findings suggesting thrombus in the  femoral and popliteal veins.   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    04/09/2015 MR Angiography Lower Extremities ([facility]):  There are three renal arteries on the left.  There is a single renal artery on the right.  There is no significant abnormality of the abdominal aorta, common iliac arteries or external iliac arteries bilaterally. There is a moderate to high grade short segmental stenosis of the right common femoral artery with a stenosis of approximately 70%. The right superficial femoral and popliteal arteries are patent. There is limited visualization of the arteries below the knee due to venous contamination and superimposed venous structures. There appears to be a patent right posterior tibial artery to the ankle, although there are multiple areas of moderate stenosis proximally. There appears to be occlusion with reconstitution distally of the anterior tibial and peroneal arteries on the right. On the left there is an occluded anterior tibial artery proximally. There is a patent left posterior tibial artery with a moderate stenosis proximally. The left peroneal artery is patent proximally and appears to be occluded distally.        History of DVT (deep vein thrombosis) 12/08/2020         Review of Systems   Constitutional: Negative.   HENT: Negative.     Eyes: Negative.    Cardiovascular:         Leg Pain   Respiratory: Negative.     Endocrine: Negative.    Hematologic/Lymphatic: Negative.    Skin: Negative.    Musculoskeletal: Negative.    Gastrointestinal: Negative.    Genitourinary: Negative.    Neurological: Negative.    Psychiatric/Behavioral: Negative.     Allergic/Immunologic: Negative.        Physical Exam  Constitutional: Oriented to person, place, and time. Appears well-developed and well-nourished.   HENT: Normocephalic and atraumatic. Conjunctivae and EOM are normal. Pupils are equal, round.  Neck: Neck supple. Normal carotid pulses, carotid bruit is not present. No hepatojugular reflux and no JVD present.  No thyromegaly present.   Cardiovascular: Normal rate, regular rhythm, S1 normal, S2 normal, no murmur heard, no gallops and no friction rub. PMI is not displaced.  Chest: No tenderness.  Pulmonary: Effort normal and breath sounds normal. No respiratory distress. Clear to auscultation bilaterally.    Abdominal: Soft. Bowel sounds are normal. No distension, no mass, no splenomegaly or hepatomegaly. There is no tenderness. There is no rebound, no guarding.   Back: No vertebral or CVA tenderness.   Lower Extremities: Bilateral edema R>L, no clubbing or cyanosis. 2+ peripheral pulses.  Normal capillary refill. Right distal calf wound.  Neurological: Non focal.   Skin: Skin is warm and dry. No rash noted.   Psychiatric: Normal mood and affect.      Cardiovascular Studies      Cardiovascular Health Factors  Vitals BP Readings from Last 3 Encounters:   09/22/23 116/74   09/22/23 116/74   08/16/23 128/66     Wt Readings from Last 3 Encounters:   09/22/23 100.7 kg (222 lb 0.1 oz)   09/22/23 100.7 kg (222 lb)   08/16/23 98.9 kg (218 lb)     BMI Readings from Last 3 Encounters:   09/22/23 30.33 kg/m?   09/22/23 30.11 kg/m?   08/16/23 29.57 kg/m?      Smoking Social History     Tobacco  Use   Smoking Status Former    Current packs/day: 0.00    Average packs/day: 1 pack/day for 3.0 years (3.0 ttl pk-yrs)    Types: Cigarettes    Quit date: 03/15/1968    Years since quitting: 55.5   Smokeless Tobacco Never      Lipid Profile Cholesterol   Date Value Ref Range Status   08/23/2021 76 <200 MG/DL Final     HDL   Date Value Ref Range Status   08/23/2021 31 (L) >40 MG/DL Final     LDL   Date Value Ref Range Status   08/23/2021 27 <100 mg/dL Final     Triglycerides   Date Value Ref Range Status   08/23/2021 144 <150 MG/DL Final      Blood Sugar Hemoglobin A1C   Date Value Ref Range Status   03/28/2023 6.7 (H) 4.5 - 6.5 Final     Glucose   Date Value Ref Range Status   10/07/2022 108 (H) 70 - 105 Final   07/26/2022 174 (H) 70 - 105 Final   12/08/2021 199 (H) 70 - 105 Final     Glucose, POC   Date Value Ref Range Status   08/25/2021 154 (H) 70 - 100 MG/DL Final   93/87/7976 851 (H) 70 - 100 MG/DL Final   93/87/7976 855 (H) 70 - 100 MG/DL Final          Problems Addressed Today  Encounter Diagnoses   Name Primary?    PVD (peripheral vascular disease) Yes    Wound of right lower extremity, initial encounter     Essential hypertension     Mixed hyperlipidemia     Chronic anticoagulation on Xarelto      History of DVT (deep vein thrombosis)     CAD in native artery        Assessment and Plan       #1 severe peripheral arterial disease in the right lower extremity and nonhealing ulcer/wound in the right distal calf since December 2024  I will refer the patient to wound clinic at [place] with Dr. Geroge  I will obtain right lower extremity ABI, TBI and arterial duplex today  I plan to proceed with right lower extremity peripheral angiography and possible intervention most likely to the right popliteal artery due to nonhealing wound of the right leg for more than 57-months  Continue aspirin  and high intensity statin and risk factor control    2.  Coronary artery disease  Per primary cardiologist Dr. Jude    3.  Hypertension, hyperlipidemia, diabetes  Per PCP    4. Obesity  Weight loss was advised    5.  History of smoking  I advised him to remain abstinent    6.  History of DVT  He is on Xarelto            Current Medications (including today's revisions)   amLODIPine (NORVASC) 10 mg tablet Take one tablet by mouth daily. Hold until follow up with PCP or cardiology    aspirin  EC (ASPIR-LOW) 81 mg tablet Take one tablet by mouth daily.    atorvastatin  (LIPITOR) 80 mg tablet TAKE 1 TABLET BY MOUTH EVERY DAY    carvediloL  (COREG ) 12.5 mg tablet TAKE ONE TABLET BY MOUTH TWICE DAILY. TAKE WITH FOOD.    dapagliflozin  (FARXIGA ) 10 mg tablet Take one tablet by mouth daily. Indications: heart failure with reduced ejection fraction    eplerenone  (INSPRA ) 25 mg tablet Take one tablet by mouth daily.  ezetimibe  (ZETIA ) 10 mg tablet TAKE 1 TABLET BY MOUTH DAILY    gabapentin  (NEURONTIN ) 400 mg capsule Take one capsule by mouth three times daily.    HYDROcodone /acetaminophen  (NORCO) 10/325 mg tablet Take one tablet by mouth every 6 hours as needed.    insulin  detemir U-100(+) (LEVEMIR) 100 unit/mL vial Inject twenty Units to thirty two Units under the skin daily. Sliding scale    lisinopriL  (ZESTRIL ) 2.5 mg tablet Take one tablet by mouth daily. Hold until follow up with PCP or cardiology    metFORMIN (GLUCOPHAGE) 1,000 mg tablet Take one tablet by mouth daily.    nitroglycerin  (NITROSTAT ) 0.4 mg tablet Place one tablet under tongue every 5 minutes as needed for Chest Pain. Max of 3 tablets, call 911.    SANTYL 250 unit/gram topical ointment Apply 1 inch topically to affected area daily.    TRADJENTA 5 mg tablet Take one tablet by mouth daily.    XARELTO  20 mg tablet Take one tablet by mouth daily.

## 2023-09-23 ENCOUNTER — Encounter: Admit: 2023-09-23 | Discharge: 2023-09-23 | Payer: MEDICARE

## 2023-09-23 DIAGNOSIS — M79606 Pain in leg, unspecified: Secondary | ICD-10-CM

## 2023-09-23 DIAGNOSIS — I739 Peripheral vascular disease, unspecified: Principal | ICD-10-CM

## 2023-09-23 NOTE — Telephone Encounter
 Left VM requesting call back to review results/recommendations.

## 2023-09-23 NOTE — Telephone Encounter
-----   Message -----   From: Hajj, Inetta SQUIBB, MD   Sent: 09/22/2023   1:05 PM CDT   To: Delon Must, RN   Subject: arterial duplex                                  Randall,     I reviewed arterial duplex images done today   He has severe right popliteal artery stenosis. I recommend peripheral angiogram and intervention.   Please notify Norleen with [facility] of procedure date (phone number [phone]) with possible need for Supera stent   Bridge with lovenox  when you hold Xarelto  if kidney function is normal (he needs labs)     Thanks   [facility]

## 2023-09-26 LAB — BASIC METABOLIC PANEL
ANION GAP: 9
BLD UREA NITROGEN: 10 mL/min (ref >60–0.80)
CALCIUM: 8.9
CHLORIDE: 104 mmol/L — ABNORMAL LOW (ref 21–30)
CO2: 25 10*3/uL (ref 3–12)
CREATININE: 1.1 10*3/uL (ref 0.00–0.45)
GFR ESTIMATED: 69
GLUCOSE,PANEL: 158 10*3/uL (ref 0.00–0.20)
POTASSIUM: 4.2 U/L (ref 7–56)
SODIUM: 138 U/L (ref 7–40)

## 2023-09-27 ENCOUNTER — Encounter: Admit: 2023-09-27 | Discharge: 2023-09-27 | Payer: MEDICARE

## 2023-09-27 DIAGNOSIS — I739 Peripheral vascular disease, unspecified: Principal | ICD-10-CM

## 2023-09-27 DIAGNOSIS — M79606 Pain in leg, unspecified: Secondary | ICD-10-CM

## 2023-09-27 MED ORDER — ENOXAPARIN 100 MG/ML SC SYRG
100 mg | Freq: Two times a day (BID) | SUBCUTANEOUS | 0 refills | 7.00000 days | Status: AC
Start: 2023-09-27 — End: ?

## 2023-09-27 NOTE — Telephone Encounter
 Left VM requesting call back to review pre procedure instructions.

## 2023-09-27 NOTE — Progress Notes
 Website with [facility] confirmed benefits and eligibility:  Current and active since 03/16/23, $257 deductible with required co-insurance of 20% to max OOP $9350, then plan will pay 100% of allowable charges.   No pre-certification is required for AARO [address]3752

## 2023-10-06 ENCOUNTER — Encounter: Admit: 2023-10-06 | Discharge: 2023-10-06 | Payer: MEDICARE

## 2023-10-07 ENCOUNTER — Encounter: Admit: 2023-10-07 | Discharge: 2023-10-07 | Payer: MEDICARE

## 2023-10-07 DIAGNOSIS — I739 Peripheral vascular disease, unspecified: Principal | ICD-10-CM
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
## 2023-10-12 ENCOUNTER — Encounter: Admit: 2023-10-12 | Discharge: 2023-10-12 | Payer: MEDICARE

## 2023-10-13 ENCOUNTER — Encounter: Admit: 2023-10-13 | Discharge: 2023-10-13 | Payer: MEDICARE

## 2023-10-13 ENCOUNTER — Ambulatory Visit: Admit: 2023-10-13 | Discharge: 2023-10-14 | Payer: MEDICARE

## 2023-10-13 DIAGNOSIS — I87009 Postthrombotic syndrome without complications of unspecified extremity: Secondary | ICD-10-CM

## 2023-10-13 DIAGNOSIS — I872 Venous insufficiency (chronic) (peripheral): Secondary | ICD-10-CM

## 2023-10-13 DIAGNOSIS — I87301 Chronic venous hypertension (idiopathic) without complications of right lower extremity: Secondary | ICD-10-CM

## 2023-10-13 NOTE — Patient Instructions
 Clean and dry, bag in shower  Elevation  Aquacel Ag MWF with abd pad or minipad to cover MWF secure with kerlix or kling MWF   Secure with segmental tubigrip compression toes to knee  RTC 2 weeks

## 2023-10-14 ENCOUNTER — Encounter: Admit: 2023-10-14 | Discharge: 2023-10-14 | Payer: MEDICARE

## 2023-10-14 DIAGNOSIS — I739 Peripheral vascular disease, unspecified: Principal | ICD-10-CM

## 2023-10-14 DIAGNOSIS — L97812 Non-pressure chronic ulcer of other part of right lower leg with fat layer exposed: Secondary | ICD-10-CM

## 2023-10-14 DIAGNOSIS — S81801A Unspecified open wound, right lower leg, initial encounter: Secondary | ICD-10-CM

## 2023-10-17 ENCOUNTER — Encounter: Admit: 2023-10-17 | Discharge: 2023-10-17 | Payer: MEDICARE

## 2023-10-17 NOTE — Telephone Encounter
-----   Message from Corean DELENA Harness, PA-C sent at 10/10/2023  8:04 AM CDT -----  Regarding: needs Leqvio  Pt has PAD with intervention by Dr. Leva on 7/24. His LDL is 98 on Atorvastatin  80mg  daily and Zetia . I discussed PCSK9 inhibitors and Leqvio with patient. He says he cannot afford to add another expensive medication and his vision is impaired so he does not think he would do well trying to give himself injections. Can you please see if you can get Leqvio approved by his insurance? Thanks so much.

## 2023-10-17 NOTE — Telephone Encounter
 Start Form completed and faxed to [phone].

## 2023-10-27 ENCOUNTER — Encounter: Admit: 2023-10-27 | Discharge: 2023-10-27 | Payer: MEDICARE
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
## 2023-11-03 ENCOUNTER — Encounter: Admit: 2023-11-03 | Discharge: 2023-11-03 | Payer: MEDICARE

## 2023-11-03 ENCOUNTER — Ambulatory Visit: Admit: 2023-11-03 | Discharge: 2023-11-04 | Payer: MEDICARE

## 2023-11-03 DIAGNOSIS — I87009 Postthrombotic syndrome without complications of unspecified extremity: Principal | ICD-10-CM

## 2023-11-03 DIAGNOSIS — I87301 Chronic venous hypertension (idiopathic) without complications of right lower extremity: Secondary | ICD-10-CM

## 2023-11-03 NOTE — Patient Instructions
 Clean and dry, bag in shower  Elevation  Aquacel Ag MWF with abd pad or minipad to cover MWF secure with kerlix or kling MWF   Secure with segmental tubigrip compression toes to knee  Get graded compression socks knee high 20-30 mmHg open -toed  RTC 2 weeks

## 2023-11-04 DIAGNOSIS — L97812 Non-pressure chronic ulcer of other part of right lower leg with fat layer exposed: Secondary | ICD-10-CM

## 2023-11-04 DIAGNOSIS — I872 Venous insufficiency (chronic) (peripheral): Secondary | ICD-10-CM

## 2023-11-16 ENCOUNTER — Encounter: Admit: 2023-11-16 | Discharge: 2023-11-16 | Payer: MEDICARE | Primary: General Practice

## 2023-11-17 ENCOUNTER — Encounter: Admit: 2023-11-17 | Discharge: 2023-11-17 | Payer: MEDICARE | Primary: General Practice

## 2023-11-17 ENCOUNTER — Ambulatory Visit: Admit: 2023-11-17 | Discharge: 2023-11-17 | Payer: MEDICARE | Primary: General Practice

## 2023-11-17 DIAGNOSIS — E782 Mixed hyperlipidemia: Secondary | ICD-10-CM

## 2023-11-17 DIAGNOSIS — I2510 Atherosclerotic heart disease of native coronary artery without angina pectoris: Secondary | ICD-10-CM

## 2023-11-17 DIAGNOSIS — S81801A Unspecified open wound, right lower leg, initial encounter: Principal | ICD-10-CM

## 2023-11-17 DIAGNOSIS — E785 Hyperlipidemia, unspecified: Secondary | ICD-10-CM

## 2023-11-17 DIAGNOSIS — I739 Peripheral vascular disease, unspecified: Secondary | ICD-10-CM

## 2023-11-17 MED ORDER — ISOSORBIDE MONONITRATE 30 MG PO TB24
30 mg | ORAL_TABLET | Freq: Every morning | ORAL | 1 refills | 90.00000 days | Status: AC
Start: 2023-11-17 — End: ?

## 2023-11-17 MED ORDER — EZETIMIBE 10 MG PO TAB
10 mg | ORAL_TABLET | Freq: Every day | ORAL | 3 refills | 90.00000 days | Status: AC
Start: 2023-11-17 — End: ?
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
## 2023-11-17 NOTE — Patient Instructions
 Thank you for visiting our office today.    We would like to make the following medication adjustments:      Imdur  30mg  daily before activity  Zetia  10mg  daily       Otherwise continue the same medications as you have been doing.          We will be pursuing the following tests after your appointment today:       Orders Placed This Encounter    isosorbide  mononitrate ER (IMDUR ) 30 mg tablet,extended release 24 hr    ezetimibe  (ZETIA ) 10 mg tablet         We will plan to see you back in 6 months.  Please call us  in the meantime with any questions or concerns.        Please allow 5-7 business days for our providers to review your results. All normal results will go to MyChart. If you do not have Mychart, it is strongly recommended to get this so you can easily view all your results. If you do not have mychart, we will attempt to call you once with normal lab and testing results. If we cannot reach you by phone with normal results, we will send you a letter.  If you have not heard the results of your testing after one week please give us  a call.       Your Cardiovascular Medicine [place] Team [place], [place], [place], and [place])  phone number is [phone].

## 2023-11-24 ENCOUNTER — Encounter: Admit: 2023-11-24 | Discharge: 2023-11-24 | Payer: MEDICARE | Primary: General Practice

## 2023-12-06 ENCOUNTER — Encounter: Admit: 2023-12-06 | Discharge: 2023-12-06 | Payer: MEDICARE | Primary: General Practice

## 2023-12-06 NOTE — Telephone Encounter
 Received message via Teams from T Anvik NP -     Allen Lloyd ([id number]) on my schedule next week 10/2 that is a patient of Hajj who underwent a DCB and bioabsorbable stent in mid-July.      He was supposed to have a follow up with Dr. Leva in 3 months with repeat imaging (ABIs and LE ultrasound both of which are ordered) but he is scheduled with me and neither of those studies are scheduled.      He lives in [place] so we were really hoping to plan the studies and OV at the same time.      Can you do one of the two following:   - Get his studies scheduled on 10/2 so we can review at our OV   or if they can do the studies that day;  - Reschedule his appt to a day where we we can get the studies so he only makes one trip up here? Can be with Hajj or I.

## 2023-12-10 ENCOUNTER — Encounter: Admit: 2023-12-10 | Discharge: 2023-12-10 | Payer: MEDICARE | Primary: General Practice

## 2024-01-07 ENCOUNTER — Encounter: Admit: 2024-01-07 | Discharge: 2024-01-07 | Payer: MEDICARE | Primary: General Practice

## 2024-01-26 ENCOUNTER — Ambulatory Visit: Admit: 2024-01-26 | Discharge: 2024-01-26 | Payer: MEDICARE | Primary: General Practice

## 2024-01-26 ENCOUNTER — Encounter: Admit: 2024-01-26 | Discharge: 2024-01-26 | Payer: MEDICARE | Primary: General Practice

## 2024-01-26 NOTE — Progress Notes [1]
 Received request from [employer] to renew patient assistance. Obtained patient signatures today while at [facility] for imaging. Application completed and faxed back to [employer] Patient Assistance.

## 2024-01-30 ENCOUNTER — Encounter: Admit: 2024-01-30 | Discharge: 2024-01-30 | Payer: MEDICARE | Primary: General Practice

## 2024-01-30 DIAGNOSIS — I739 Peripheral vascular disease, unspecified: Principal | ICD-10-CM

## 2024-01-30 NOTE — Telephone Encounter [36]
-----   Message from KANDICE Eis, MD sent at 01/30/2024  9:24 AM CST -----  Looks good  Suggest obtain bilateral ABI and LE arterial duplex in 6 months  Thanks  [facility]  ----- Message -----  From: Royce Ann, MD  Sent: 01/27/2024   6:47 PM CST  To: Inetta SQUIBB Hajj, MD

## 2024-01-30 NOTE — Telephone Encounter [36]
 Reviewed results with patient, verbalized understanding.     Patient reports he needs to cancel his December appointment as he has no transportation since his daughter got a new job and she is only off on weekends. Patient agreeable to let this RN reach out to social work to see if we have any options to arrange transport via medicare. Patient agreeable to hold on canceling until we explore our options.

## 2024-01-31 ENCOUNTER — Encounter: Admit: 2024-01-31 | Discharge: 2024-01-31 | Payer: MEDICARE | Primary: General Practice

## 2024-01-31 NOTE — Telephone Encounter [36]
 Progress Note  SW Case Manager    Plan: Transportation Resources    Intervention: SW made out bound call to patient to further discuss transportation resources for [employer] in December. SW unable to reach patient at this time and left identified voicemail with call back information.     Almarie Sharps, LMSW  Outpatient Heart Failure Social Worker  Cell: [phone]  Office: [phone]

## 2024-02-03 ENCOUNTER — Encounter: Admit: 2024-02-03 | Discharge: 2024-02-03 | Payer: MEDICARE | Primary: General Practice

## 2024-02-16 ENCOUNTER — Encounter: Admit: 2024-02-16 | Discharge: 2024-02-16 | Payer: MEDICARE | Primary: General Practice

## 2024-02-20 ENCOUNTER — Encounter: Admit: 2024-02-20 | Discharge: 2024-02-20 | Payer: MEDICARE | Primary: General Practice

## 2024-02-20 ENCOUNTER — Inpatient Hospital Stay
Admission: EM | Admit: 2024-02-20 | Discharge: 2024-02-22 | Disposition: A | Discharge status: Home / Self Care | Payer: MEDICARE

## 2024-02-20 ENCOUNTER — Emergency Department: Admit: 2024-02-20 | Discharge: 2024-02-20 | Payer: MEDICARE

## 2024-02-21 ENCOUNTER — Inpatient Hospital Stay: Admit: 2024-02-21 | Discharge: 2024-02-21 | Payer: MEDICARE | Primary: General Practice

## 2024-02-21 ENCOUNTER — Encounter: Admit: 2024-02-21 | Discharge: 2024-02-21 | Payer: MEDICARE | Primary: General Practice

## 2024-02-21 DIAGNOSIS — E785 Hyperlipidemia, unspecified: Principal | ICD-10-CM

## 2024-02-21 NOTE — Telephone Encounter [36]
 Patient called and LVM that he is currently admitted at [facility] hospital. Patient requesting reconsideration of Leqvio injections as he has lost 30 lbs and has changed his diet. Reports he may be discharged tomorrow.

## 2024-02-21 NOTE — Telephone Encounter [36]
 Unable to reach patient, discussed with Dr. Starlett and requested FLP recheck prior to advising on not moving forward with Leqvio.     Order added on to labs drawn inpatient today.

## 2024-03-01 ENCOUNTER — Encounter: Admit: 2024-03-01 | Discharge: 2024-03-01 | Payer: MEDICARE

## 2024-03-05 ENCOUNTER — Encounter: Admit: 2024-03-05 | Discharge: 2024-03-05 | Payer: MEDICARE

## 2024-03-07 ENCOUNTER — Emergency Department: Admit: 2024-03-07 | Discharge: 2024-03-08 | Disposition: A | Discharge status: Home / Self Care | Payer: MEDICARE

## 2024-03-07 ENCOUNTER — Encounter: Admit: 2024-03-07 | Discharge: 2024-03-07 | Payer: MEDICARE

## 2024-03-12 ENCOUNTER — Encounter: Admit: 2024-03-12 | Discharge: 2024-03-12 | Payer: MEDICARE

## 2024-03-22 ENCOUNTER — Encounter: Admit: 2024-03-22 | Discharge: 2024-03-22 | Payer: MEDICARE

## 2024-03-23 ENCOUNTER — Encounter: Admit: 2024-03-23 | Discharge: 2024-03-23 | Payer: MEDICARE

## 2024-04-09 ENCOUNTER — Encounter: Admit: 2024-04-09 | Discharge: 2024-04-09 | Payer: MEDICARE
